# Patient Record
Sex: Male | Born: 1955 | Race: Black or African American | Hispanic: No | Marital: Single
Health system: Midwestern US, Community
[De-identification: ages and names within clinical notes are randomized; demographics above are authoritative.]

## PROBLEM LIST (undated history)

## (undated) DIAGNOSIS — I2510 Atherosclerotic heart disease of native coronary artery without angina pectoris: Secondary | ICD-10-CM

## (undated) DIAGNOSIS — E119 Type 2 diabetes mellitus without complications: Secondary | ICD-10-CM

## (undated) DIAGNOSIS — I639 Cerebral infarction, unspecified: Secondary | ICD-10-CM

## (undated) DIAGNOSIS — Z87898 Personal history of other specified conditions: Secondary | ICD-10-CM

## (undated) DIAGNOSIS — G4733 Obstructive sleep apnea (adult) (pediatric): Secondary | ICD-10-CM

## (undated) DIAGNOSIS — M109 Gout, unspecified: Secondary | ICD-10-CM

## (undated) DIAGNOSIS — K219 Gastro-esophageal reflux disease without esophagitis: Secondary | ICD-10-CM

## (undated) DIAGNOSIS — I10 Essential (primary) hypertension: Secondary | ICD-10-CM

## (undated) DIAGNOSIS — R011 Cardiac murmur, unspecified: Secondary | ICD-10-CM

## (undated) DIAGNOSIS — G8929 Other chronic pain: Secondary | ICD-10-CM

## (undated) DIAGNOSIS — M1990 Unspecified osteoarthritis, unspecified site: Secondary | ICD-10-CM

## (undated) DIAGNOSIS — M25561 Pain in right knee: Secondary | ICD-10-CM

## (undated) DIAGNOSIS — I25119 Atherosclerotic heart disease of native coronary artery with unspecified angina pectoris: Secondary | ICD-10-CM

## (undated) DIAGNOSIS — I252 Old myocardial infarction: Secondary | ICD-10-CM

## (undated) DIAGNOSIS — K2950 Unspecified chronic gastritis without bleeding: Secondary | ICD-10-CM

## (undated) DIAGNOSIS — M25562 Pain in left knee: Secondary | ICD-10-CM

## (undated) DIAGNOSIS — Z0389 Encounter for observation for other suspected diseases and conditions ruled out: Secondary | ICD-10-CM

## (undated) DIAGNOSIS — A048 Other specified bacterial intestinal infections: Secondary | ICD-10-CM

## (undated) HISTORY — PX: EYE SURGERY: SHX253

## (undated) HISTORY — PX: BUNIONECTOMY: SHX129

## (undated) HISTORY — PX: JOINT REPLACEMENT: SHX530

---

## 2014-09-24 DIAGNOSIS — I214 Non-ST elevation (NSTEMI) myocardial infarction: Secondary | ICD-10-CM

## 2014-09-24 HISTORY — DX: Non-ST elevation (NSTEMI) myocardial infarction: I21.4

## 2014-09-24 HISTORY — PX: CARDIAC CATHETERIZATION: SHX172

## 2020-02-17 ENCOUNTER — Inpatient Hospital Stay
Admit: 2020-02-17 | Discharge: 2020-02-18 | Disposition: A | Discharge status: Home / Self Care | Payer: MEDICARE | Attending: Emergency Medicine

## 2020-02-17 ENCOUNTER — Emergency Department: Admit: 2020-02-18 | Payer: MEDICARE | Primary: Family Medicine

## 2020-02-17 DIAGNOSIS — B349 Viral infection, unspecified: Secondary | ICD-10-CM

## 2020-02-17 NOTE — ED Provider Notes (Signed)
Patient presents with complaint of chills and body aches for past 2 days . He reports frequent urination and denies cough, shortness of breath, or abdominal pain. He has a mild headache. No neck stiffness He has no other complaints. He received his first covid vaccine.            Past Medical History:   Diagnosis Date   ??? Chronic GERD    ??? Diabetes (HCC)    ??? Erectile dysfunction    ??? Hypercholesteremia    ??? Hypertension        History reviewed. No pertinent surgical history.      History reviewed. No pertinent family history.    Social History     Socioeconomic History   ??? Marital status: SINGLE     Spouse name: Not on file   ??? Number of children: Not on file   ??? Years of education: Not on file   ??? Highest education level: Not on file   Occupational History   ??? Not on file   Tobacco Use   ??? Smoking status: Never Smoker   ??? Smokeless tobacco: Never Used   Substance and Sexual Activity   ??? Alcohol use: Not on file   ??? Drug use: Not on file   ??? Sexual activity: Not on file   Other Topics Concern   ??? Not on file   Social History Narrative   ??? Not on file     Social Determinants of Health     Financial Resource Strain:    ??? Difficulty of Paying Living Expenses:    Food Insecurity:    ??? Worried About Programme researcher, broadcasting/film/video in the Last Year:    ??? Barista in the Last Year:    Transportation Needs:    ??? Freight forwarder (Medical):    ??? Lack of Transportation (Non-Medical):    Physical Activity:    ??? Days of Exercise per Week:    ??? Minutes of Exercise per Session:    Stress:    ??? Feeling of Stress :    Social Connections:    ??? Frequency of Communication with Friends and Family:    ??? Frequency of Social Gatherings with Friends and Family:    ??? Attends Religious Services:    ??? Database administrator or Organizations:    ??? Attends Engineer, structural:    ??? Marital Status:    Intimate Programme researcher, broadcasting/film/video Violence:    ??? Fear of Current or Ex-Partner:    ??? Emotionally Abused:    ??? Physically Abused:    ??? Sexually Abused:           ALLERGIES: Patient has no known allergies.    Review of Systems   Constitutional: Positive for chills and fever.   HENT: Negative.    Eyes: Negative.    Respiratory: Negative.  Negative for cough and shortness of breath.    Cardiovascular: Negative.  Negative for chest pain.   Gastrointestinal: Negative for abdominal pain, diarrhea, nausea and vomiting.   Endocrine: Negative.    Genitourinary: Negative.    Musculoskeletal: Positive for arthralgias and myalgias.   Skin: Negative.    Allergic/Immunologic: Negative.    Neurological: Negative.    Hematological: Negative.    Psychiatric/Behavioral: Negative.        Vitals:    02/17/20 2003   BP: (!) 144/76   Pulse: 80   Resp: 16   Temp: 99.2 ??F (37.3 ??C)  SpO2: 96%   Weight: 110.7 kg (244 lb)   Height: 5\' 8"  (1.727 m)            Physical Exam  Vitals and nursing note reviewed.   HENT:      Head: Normocephalic and atraumatic.      Mouth/Throat:      Mouth: Mucous membranes are moist.   Eyes:      Extraocular Movements: Extraocular movements intact.      Pupils: Pupils are equal, round, and reactive to light.   Cardiovascular:      Rate and Rhythm: Normal rate and regular rhythm.      Pulses: Normal pulses.      Heart sounds: Normal heart sounds.   Pulmonary:      Effort: Pulmonary effort is normal.      Breath sounds: Normal breath sounds.   Abdominal:      General: Abdomen is flat. Bowel sounds are normal.      Palpations: Abdomen is soft.   Musculoskeletal:         General: Normal range of motion.      Cervical back: Normal range of motion and neck supple.   Skin:     General: Skin is warm and dry.   Neurological:      General: No focal deficit present.      Mental Status: He is oriented to person, place, and time. Mental status is at baseline.   Psychiatric:         Mood and Affect: Mood normal.         Behavior: Behavior normal.          MDM       Procedures

## 2020-02-17 NOTE — ED Notes (Signed)
Patient presents to ED reporting chills and headache for two days.  Patient reports he recceived a flu shot and the first dose of his covid vaccine 1 week ago.  Patient denies fever, sore throat, cough, and congestion.

## 2020-02-18 LAB — CBC WITH AUTO DIFFERENTIAL
Basophils %: 3 % — ABNORMAL HIGH (ref 0.0–2.5)
Basophils Absolute: 0.2 10*3/uL (ref 0.0–0.2)
Eosinophils %: 3 % — ABNORMAL HIGH (ref 0.9–2.9)
Eosinophils Absolute: 0.3 10*3/uL (ref 0.0–0.7)
Hematocrit: 33.5 % — ABNORMAL LOW (ref 41–53)
Hemoglobin: 11.2 g/dL — ABNORMAL LOW (ref 13.0–16.0)
Lymphocytes %: 37 % (ref 20.5–51.1)
Lymphocytes Absolute: 3.3 10*3/uL (ref 1.0–4.8)
MCH: 29.8 PG — ABNORMAL LOW (ref 31–34)
MCHC: 33.6 g/dL (ref 31.0–36.0)
MCV: 88.7 FL (ref 80–100)
MPV: 7.8 FL (ref 6.5–11.5)
Monocytes %: 12 % (ref 3.1–13.9)
Monocytes Absolute: 1.1 10*3/uL — ABNORMAL HIGH (ref 0.0–0.8)
Neutrophils %: 45 % (ref 42–75)
Neutrophils Absolute: 4.1 10*3/uL (ref 1.8–7.7)
Platelets: 174 10*3/uL
RBC: 3.77 M/uL
RDW: 12.5 % (ref 11.5–14.5)
WBC: 9 10*3/uL (ref 4.4–11.3)

## 2020-02-18 LAB — RAPID INFLUENZA A/B ANTIGENS
Flu A Antigen: NEGATIVE
Influenza B Antigen: NEGATIVE

## 2020-02-18 LAB — COMPREHENSIVE METABOLIC PANEL
ALT: 34 U/L (ref 12–78)
AST: 24 U/L (ref 15–37)
Albumin/Globulin Ratio: 0.8 — ABNORMAL LOW (ref 1.1–2.2)
Albumin: 3.1 g/dL — ABNORMAL LOW (ref 3.5–5.0)
Alkaline Phosphatase: 45 U/L (ref 45–117)
Anion Gap: 8 mmol/L (ref 5–15)
BUN: 13 mg/dL (ref 6–20)
Bun/Cre Ratio: 14 (ref 12–20)
CO2: 29 mmol/L (ref 21–32)
Calcium: 8.6 mg/dL (ref 8.5–10.1)
Chloride: 105 mmol/L (ref 97–108)
Creatinine: 0.93 mg/dL (ref 0.70–1.30)
EGFR IF NonAfrican American: >60 mL/min/{1.73_m2} (ref >60)
GFR African American: >60 mL/min/{1.73_m2} (ref >60)
Globulin: 3.9 g/dL (ref 2.0–4.0)
Glucose: 118 mg/dL — ABNORMAL HIGH (ref 65–100)
Potassium: 3.9 mmol/L (ref 3.5–5.1)
Sodium: 142 mmol/L (ref 136–145)
Total Bilirubin: 0.3 mg/dL (ref 0.2–1.0)
Total Protein: 7 g/dL (ref 6.4–8.2)

## 2020-02-18 LAB — EKG 12-LEAD
Atrial Rate: 63 {beats}/min
P Axis: 35 degrees
P-R Interval: 164 ms
Q-T Interval: 427 ms
QRS Duration: 91 ms
QTc Calculation (Bazett): 438 ms
R Axis: 20 degrees
T Axis: 28 degrees
Ventricular Rate: 63 {beats}/min

## 2020-02-18 LAB — URINALYSIS W/ RFLX MICROSCOPIC
Bilirubin, Urine: NEGATIVE
Bilirubin: NEGATIVE
Blood, Urine: NEGATIVE
Blood: NEGATIVE
Glucose, Ur: NEGATIVE mg/dL
Glucose: NEGATIVE mg/dL
Ketone: NEGATIVE mg/dL
Ketones, Urine: NEGATIVE mg/dL
Leukocyte Esterase, Urine: NEGATIVE
Leukocyte Esterase: NEGATIVE
Nitrite, Urine: NEGATIVE
Nitrites: NEGATIVE
Protein, UA: NEGATIVE mg/dL
Protein: NEGATIVE mg/dL
Specific Gravity, UA: 1.03 (ref 1.003–1.030)
Specific gravity: 1.03 (ref 1.003–1.030)
Urobilinogen, UA, POCT: 1 EU/dL (ref 0.2–1.0)
Urobilinogen: 1 EU/dL (ref 0.2–1.0)
pH (UA): 6 (ref 5.0–8.0)
pH, UA: 6 (ref 5.0–8.0)

## 2020-02-18 LAB — COVID-19, RAPID: SARS-CoV-2, Rapid: NOT DETECTED

## 2020-02-18 LAB — METABOLIC PANEL, COMPREHENSIVE
A-G Ratio: 0.8 — ABNORMAL LOW (ref 1.1–2.2)
ALT (SGPT): 34 U/L (ref 12–78)
AST (SGOT): 24 U/L (ref 15–37)
Albumin: 3.1 g/dL — ABNORMAL LOW (ref 3.5–5.0)
Alk. phosphatase: 45 U/L (ref 45–117)
Anion gap: 8 mmol/L (ref 5–15)
BUN/Creatinine ratio: 14 (ref 12–20)
BUN: 13 mg/dL (ref 6–20)
Bilirubin, total: 0.3 mg/dL (ref 0.2–1.0)
CO2: 29 mmol/L (ref 21–32)
Calcium: 8.6 mg/dL (ref 8.5–10.1)
Chloride: 105 mmol/L (ref 97–108)
Creatinine: 0.93 mg/dL (ref 0.70–1.30)
GFR est AA: >60 mL/min/{1.73_m2} (ref >60)
GFR est non-AA: >60 mL/min/{1.73_m2} (ref >60)
Globulin: 3.9 g/dL (ref 2.0–4.0)
Glucose: 118 mg/dL — ABNORMAL HIGH (ref 65–100)
Potassium: 3.9 mmol/L (ref 3.5–5.1)
Protein, total: 7 g/dL (ref 6.4–8.2)
Sodium: 142 mmol/L (ref 136–145)

## 2020-02-18 LAB — EKG, 12 LEAD, INITIAL
Atrial Rate: 63 {beats}/min
Calculated P Axis: 35 degrees
Calculated R Axis: 20 degrees
Calculated T Axis: 28 degrees
P-R Interval: 164 ms
Q-T Interval: 427 ms
QRS Duration: 91 ms
QTC Calculation (Bezet): 438 ms
Ventricular Rate: 63 {beats}/min

## 2020-02-18 LAB — CBC WITH AUTOMATED DIFF
ABS. BASOPHILS: 0.2 10*3/uL (ref 0.0–0.2)
ABS. EOSINOPHILS: 0.3 10*3/uL (ref 0.0–0.7)
ABS. LYMPHOCYTES: 3.3 10*3/uL (ref 1.0–4.8)
ABS. MONOCYTES: 1.1 10*3/uL — ABNORMAL HIGH (ref 0.0–0.8)
ABS. NEUTROPHILS: 4.1 10*3/uL (ref 1.8–7.7)
BASOPHILS: 3 % — ABNORMAL HIGH (ref 0.0–2.5)
EOSINOPHILS: 3 % — ABNORMAL HIGH (ref 0.9–2.9)
HCT: 33.5 % — ABNORMAL LOW (ref 41–53)
HGB: 11.2 g/dL — ABNORMAL LOW (ref 13.0–16.0)
LYMPHOCYTES: 37 % (ref 20.5–51.1)
MCH: 29.8 PG — ABNORMAL LOW (ref 31–34)
MCHC: 33.6 g/dL (ref 31.0–36.0)
MCV: 88.7 FL (ref 80–100)
MONOCYTES: 12 % (ref 3.1–13.9)
MPV: 7.8 FL (ref 6.5–11.5)
NEUTROPHILS: 45 % (ref 42–75)
PLATELET: 174 10*3/uL
RBC: 3.77 M/uL
RDW: 12.5 % (ref 11.5–14.5)
WBC: 9 10*3/uL (ref 4.4–11.3)

## 2020-02-18 LAB — COVID-19 RAPID TEST: COVID-19 rapid test: NOT DETECTED

## 2020-02-18 LAB — INFLUENZA A & B AG (RAPID TEST)
Influenza A Antigen: NEGATIVE
Influenza B Antigen: NEGATIVE

## 2020-02-18 MED ORDER — SODIUM CHLORIDE 0.9 % IV
Freq: Once | INTRAVENOUS | Status: AC
Start: 2020-02-18 — End: 2020-02-18
  Administered 2020-02-18: 02:00:00 via INTRAVENOUS

## 2020-02-18 MED FILL — SODIUM CHLORIDE 0.9 % IV: INTRAVENOUS | Qty: 1000

## 2020-02-18 NOTE — ED Notes (Signed)
Patient given and verbalized understanding of all discharge, medication, and follow-up instructions.  Patient departed ED ambulatory in good condition with family member.

## 2020-02-23 LAB — CULTURE, BLOOD 1: Culture: NO GROWTH

## 2020-02-23 LAB — CULTURE, BLOOD: Culture result:: NO GROWTH

## 2020-03-20 ENCOUNTER — Emergency Department: Admit: 2020-03-20 | Payer: MEDICARE | Primary: Family Medicine

## 2020-03-20 ENCOUNTER — Inpatient Hospital Stay
Admit: 2020-03-20 | Discharge: 2020-03-20 | Disposition: A | Discharge status: Home / Self Care | Payer: MEDICARE | Attending: Emergency Medicine

## 2020-03-20 DIAGNOSIS — M25561 Pain in right knee: Secondary | ICD-10-CM

## 2020-03-20 LAB — COMPREHENSIVE METABOLIC PANEL
ALT: 23 U/L (ref 12–78)
AST: 18 U/L (ref 15–37)
Albumin/Globulin Ratio: 1 — ABNORMAL LOW (ref 1.1–2.2)
Albumin: 3.4 g/dL — ABNORMAL LOW (ref 3.5–5.0)
Alkaline Phosphatase: 45 U/L (ref 45–117)
Anion Gap: 8 mmol/L (ref 5–15)
BUN: 13 mg/dL (ref 6–20)
Bun/Cre Ratio: 14 (ref 12–20)
CO2: 29 mmol/L (ref 21–32)
Calcium: 8.5 mg/dL (ref 8.5–10.1)
Chloride: 104 mmol/L (ref 97–108)
Creatinine: 0.9 mg/dL (ref 0.70–1.30)
EGFR IF NonAfrican American: >60 mL/min/{1.73_m2} (ref >60)
GFR African American: >60 mL/min/{1.73_m2} (ref >60)
Globulin: 3.5 g/dL (ref 2.0–4.0)
Glucose: 125 mg/dL — ABNORMAL HIGH (ref 65–100)
Potassium: 4.4 mmol/L (ref 3.5–5.1)
Sodium: 141 mmol/L (ref 136–145)
Total Bilirubin: 0.4 mg/dL (ref 0.2–1.0)
Total Protein: 6.9 g/dL (ref 6.4–8.2)

## 2020-03-20 LAB — CBC WITH AUTO DIFFERENTIAL
Basophils %: 1 % (ref 0.0–2.5)
Basophils Absolute: 0.1 10*3/uL (ref 0.0–0.2)
Eosinophils %: 3 % — ABNORMAL HIGH (ref 0.9–2.9)
Eosinophils Absolute: 0.2 10*3/uL (ref 0.0–0.7)
Hematocrit: 39.2 % — ABNORMAL LOW (ref 41–53)
Hemoglobin: 13 g/dL — ABNORMAL LOW (ref 13.5–17.5)
Lymphocytes %: 27 % (ref 20.5–51.1)
Lymphocytes Absolute: 2.1 10*3/uL (ref 1.0–4.8)
MCH: 29.6 PG — ABNORMAL LOW (ref 31–34)
MCHC: 33.2 g/dL (ref 31.0–36.0)
MCV: 89 FL (ref 80–100)
MPV: 7.9 FL (ref 6.5–11.5)
Monocytes %: 8 % (ref 1.7–9.3)
Monocytes Absolute: 0.6 10*3/uL (ref 0.2–2.4)
NRBC Absolute: 0.01 10*3/uL
Neutrophils %: 61 % (ref 42–75)
Neutrophils Absolute: 4.7 10*3/uL (ref 1.8–7.7)
Nucleated RBCs: 0.2 PER 100 WBC
Platelets: 189 10*3/uL (ref 150–400)
RBC: 4.4 M/uL — ABNORMAL LOW (ref 4.50–5.90)
RDW: 13.5 % (ref 11.5–14.5)
WBC: 7.7 10*3/uL (ref 4.4–11.3)

## 2020-03-20 LAB — METABOLIC PANEL, COMPREHENSIVE
A-G Ratio: 1 — ABNORMAL LOW (ref 1.1–2.2)
ALT (SGPT): 23 U/L (ref 12–78)
AST (SGOT): 18 U/L (ref 15–37)
Albumin: 3.4 g/dL — ABNORMAL LOW (ref 3.5–5.0)
Alk. phosphatase: 45 U/L (ref 45–117)
Anion gap: 8 mmol/L (ref 5–15)
BUN/Creatinine ratio: 14 (ref 12–20)
BUN: 13 mg/dL (ref 6–20)
Bilirubin, total: 0.4 mg/dL (ref 0.2–1.0)
CO2: 29 mmol/L (ref 21–32)
Calcium: 8.5 mg/dL (ref 8.5–10.1)
Chloride: 104 mmol/L (ref 97–108)
Creatinine: 0.9 mg/dL (ref 0.70–1.30)
GFR est AA: >60 mL/min/{1.73_m2} (ref >60)
GFR est non-AA: >60 mL/min/{1.73_m2} (ref >60)
Globulin: 3.5 g/dL (ref 2.0–4.0)
Glucose: 125 mg/dL — ABNORMAL HIGH (ref 65–100)
Potassium: 4.4 mmol/L (ref 3.5–5.1)
Protein, total: 6.9 g/dL (ref 6.4–8.2)
Sodium: 141 mmol/L (ref 136–145)

## 2020-03-20 LAB — CBC WITH AUTOMATED DIFF
ABS. BASOPHILS: 0.1 10*3/uL (ref 0.0–0.2)
ABS. EOSINOPHILS: 0.2 10*3/uL (ref 0.0–0.7)
ABS. LYMPHOCYTES: 2.1 10*3/uL (ref 1.0–4.8)
ABS. MONOCYTES: 0.6 10*3/uL (ref 0.2–2.4)
ABS. NEUTROPHILS: 4.7 10*3/uL (ref 1.8–7.7)
ABSOLUTE NRBC: 0.01 10*3/uL
BASOPHILS: 1 % (ref 0.0–2.5)
EOSINOPHILS: 3 % — ABNORMAL HIGH (ref 0.9–2.9)
HCT: 39.2 % — ABNORMAL LOW (ref 41–53)
HGB: 13 g/dL — ABNORMAL LOW (ref 13.5–17.5)
LYMPHOCYTES: 27 % (ref 20.5–51.1)
MCH: 29.6 PG — ABNORMAL LOW (ref 31–34)
MCHC: 33.2 g/dL (ref 31.0–36.0)
MCV: 89 FL (ref 80–100)
MONOCYTES: 8 % (ref 1.7–9.3)
MPV: 7.9 FL (ref 6.5–11.5)
NEUTROPHILS: 61 % (ref 42–75)
NRBC: 0.2 PER 100 WBC
PLATELET: 189 10*3/uL (ref 150–400)
RBC: 4.4 M/uL — ABNORMAL LOW (ref 4.50–5.90)
RDW: 13.5 % (ref 11.5–14.5)
WBC: 7.7 10*3/uL (ref 4.4–11.3)

## 2020-03-20 NOTE — ED Provider Notes (Signed)
ED Provider Notes by Domingo Dimes, MD at 03/20/20 [phone]                Author: Domingo Dimes, MD  Service: EMERGENCY  Author Type: Physician       Filed: 03/20/20 0902  Date of Service: 03/20/20 0751  Status: Signed          Editor: Domingo Dimes, MD (Physician)               EMERGENCY DEPARTMENT HISTORY AND PHYSICAL EXAM   ?      Date: 03/20/2020   Patient Name: Adrian Saunders      History of Presenting Illness      Patient presents with:   Knee Pain         History Provided By: Patient      HPI: Adrian Saunders, 64 y.o. male with a past medical history significant for diabetes, hypertension and gout presents to the ED with cc of complains of right leg pain for 1 week.  Pain is mostly behind the right knee, but also involves the calf.   Pain is radiating into the thigh now.  He denies injury.  Pain is worse with palpation and ambulation.  Pain is severe.      There are no other complaints, changes, or physical findings at this time.      PCP: Ruben Im, MD      No current facility-administered medications on file prior to encounter.   Current Outpatient Medications on File Prior to Encounter:   pantoprazole (PROTONIX) 40 mg tablet, 1 tab(s), Disp: , Rfl:    aspirin delayed-release 81 mg tablet, 1 tab(s), Disp: , Rfl:    metFORMIN (GLUCOPHAGE) 500 mg tablet, metformin 500 mg tablet, Disp: , Rfl:    allopurinoL (ZYLOPRIM) 300 mg tablet, 1 tab(s), Disp: , Rfl:    oxyCODONE-acetaminophen (PERCOCET 10) 10-325 mg per tablet, oxycodone-acetaminophen 10 mg-325 mg tablet, Disp: , Rfl:             Past History      Past Medical History:   Past Medical History:   No date: Chronic GERD   No date: Diabetes (HCC)   No date: Erectile dysfunction   No date: Hypercholesteremia   No date: Hypertension      Past Surgical History:   No past surgical history on file.      Family History:   History reviewed.  No pertinent family history.         Social History:   Social History     Tobacco Use      Smoking status: Never Smoker      Smokeless tobacco: Never Used    Alcohol use: Not on file    Drug use: Not on file         Allergies:   No Known Allergies         Review of Systems   @ROSBYAGE @      Physical Exam   @PHYEXAMBYAGE @      Diagnostic Study Results      Labs -   No results found for this or any previous visit (from the past 12 hour(s)).      Radiologic Studies -    No orders to display   CT Results  (Last 48 hours)    None       CXR Results  (Last 48 hours)    None  Medical Decision Making   I am the first provider for this patient.      I reviewed the vital signs, available nursing notes, past medical history, past surgical history, family history and social history.      Vital Signs-Reviewed the patient's vital signs.   Empty flowsheet group.         Records Reviewed: Nursing Notes      Provider Notes (Medical Decision Making):    Differential diagnosis consists of DVT, Baker's cyst, gouty arthritis.      ED Course:    Initial assessment performed. The patients presenting problems have been discussed, and they are in agreement with the care plan formulated and outlined with them.  I have encouraged them to ask questions as they arise throughout their visit.                     PLAN:   1. Current Discharge Medication List         2. Follow-up Information     None      Return to ED if worse       Diagnosis      Clinical Impression: No diagnosis found.         ?                  Past Medical History:        Diagnosis  Date         ?  Chronic GERD       ?  Diabetes (HCC)       ?  Erectile dysfunction       ?  Hypercholesteremia           ?  Hypertension             No past surgical history on file.        History reviewed. No pertinent family history.        Social History          Socioeconomic History         ?  Marital status:  SINGLE              Spouse name:  Not on file         ?  Number of children:  Not on file     ?  Years of education:  Not on file     ?  Highest  education level:  Not on file       Occupational History        ?  Not on file       Tobacco Use         ?  Smoking status:  Never Smoker     ?  Smokeless tobacco:  Never Used       Substance and Sexual Activity         ?  Alcohol use:  Not on file     ?  Drug use:  Not on file     ?  Sexual activity:  Not on file        Other Topics  Concern        ?  Not on file       Social History Narrative        ?  Not on file          Social Determinants of Health          Financial Resource Strain:         ?  Difficulty of Paying Living Expenses:        Food Insecurity:         ?  Worried About Programme researcher, broadcasting/film/video in the Last Year:      ?  Barista in the Last Year:        Transportation Needs:         ?  Freight forwarder (Medical):      ?  Lack of Transportation (Non-Medical):        Physical Activity:         ?  Days of Exercise per Week:      ?  Minutes of Exercise per Session:        Stress:         ?  Feeling of Stress :        Social Connections:         ?  Frequency of Communication with Friends and Family:      ?  Frequency of Social Gatherings with Friends and Family:      ?  Attends Religious Services:      ?  Active Member of Clubs or Organizations:      ?  Attends Banker Meetings:      ?  Marital Status:        Intimate Partner Violence:         ?  Fear of Current or Ex-Partner:      ?  Emotionally Abused:      ?  Physically Abused:         ?  Sexually Abused:               ALLERGIES: Patient has no known allergies.      Review of Systems    Constitutional: Negative.     HENT: Negative.     Eyes: Negative.     Respiratory: Negative.     Cardiovascular: Negative.     Gastrointestinal: Negative.     Endocrine: Negative.     Genitourinary: Negative.     Musculoskeletal:         Right leg pain, calf pain and thigh pain    Skin: Negative.     Allergic/Immunologic: Negative.     Neurological: Negative.     Hematological: Negative.     Psychiatric/Behavioral: Negative.             Vitals:           03/20/20 0717        BP:  (!) 178/94     Pulse:  77     Resp:  17     Temp:  99.1 ??F (37.3 ??C)     SpO2:  99%     Weight:  104.3 kg (230 lb)        Height:  5\' 8"  (1.727 m)                Physical Exam   Vitals and nursing note reviewed.   Constitutional:        Appearance: Normal appearance.   Eyes:       Extraocular Movements: Extraocular movements intact.      Conjunctiva/sclera: Conjunctivae normal.      Pupils: Pupils are equal, round, and reactive to light.   Cardiovascular :       Rate and Rhythm: Normal rate and regular rhythm.      Pulses: Normal pulses.  Heart sounds: Normal heart sounds.    Pulmonary:       Effort: Pulmonary effort is normal.      Breath sounds: Normal breath sounds.   Abdominal :      General: Abdomen is flat. Bowel sounds are normal.      Palpations: Abdomen is soft.     Musculoskeletal:        Legs:         Comments: Tender popliteal area, tender calf.    Skin:      General: Skin is warm.   Neurological :       Mental Status: He is alert.             MDM          Procedures

## 2020-03-20 NOTE — ED Notes (Signed)
Pt reports knee pain x 2 weeks. PT reports hx of blood clots. Pt reports hx of gout

## 2020-09-07 ENCOUNTER — Inpatient Hospital Stay
Admit: 2020-09-07 | Discharge: 2020-09-07 | Disposition: A | Discharge status: Home / Self Care | Payer: MEDICARE | Attending: Emergency Medicine

## 2020-09-07 DIAGNOSIS — M10341 Gout due to renal impairment, right hand: Secondary | ICD-10-CM

## 2020-09-07 MED ORDER — IBUPROFEN 600 MG TAB
600 mg | ORAL | Status: AC
Start: 2020-09-07 — End: 2020-09-07
  Administered 2020-09-07: 18:00:00 via ORAL

## 2020-09-07 MED ORDER — PREDNISONE 20 MG TAB
20 mg | ORAL | Status: AC
Start: 2020-09-07 — End: 2020-09-07
  Administered 2020-09-07: 18:00:00 via ORAL

## 2020-09-07 MED ORDER — PREDNISONE 20 MG TAB
20 mg | ORAL_TABLET | Freq: Every day | ORAL | 0 refills | Status: AC
Start: 2020-09-07 — End: 2020-09-12

## 2020-09-07 MED FILL — IBUPROFEN 600 MG TAB: 600 mg | ORAL | Qty: 1

## 2020-09-07 MED FILL — PREDNISONE 20 MG TAB: 20 mg | ORAL | Qty: 3

## 2020-09-07 NOTE — ED Notes (Signed)
Pt given discharge and follow up instructions. Pt advised to follow with PCP. tt educated by MD tto hold allopurinol until flare subsides. Pain management and meds education given.

## 2020-09-07 NOTE — ED Provider Notes (Signed)
HPI   Patient reports several days of spontaneous and worsening pain and swelling in the right hand.  Is mainly on the dorsal surface and about the ring finger metacarpal.  He has a history of gout but reports he has never had involvement in an upper extremity.  He is currently on allopurinol for suppression and reports that this has not been effective.  Denies any other complaints.  Denies any injury or trauma.  Past Medical History:   Diagnosis Date   ??? Chronic GERD    ??? Diabetes (HCC)    ??? Erectile dysfunction    ??? Hypercholesteremia    ??? Hypertension        No past surgical history on file.      No family history on file.    Social History     Socioeconomic History   ??? Marital status: SINGLE     Spouse name: Not on file   ??? Number of children: Not on file   ??? Years of education: Not on file   ??? Highest education level: Not on file   Occupational History   ??? Not on file   Tobacco Use   ??? Smoking status: Never Smoker   ??? Smokeless tobacco: Never Used   Substance and Sexual Activity   ??? Alcohol use: Yes     Comment: occasionally   ??? Drug use: Not on file   ??? Sexual activity: Not on file   Other Topics Concern   ??? Not on file   Social History Narrative   ??? Not on file     Social Determinants of Health     Financial Resource Strain:    ??? Difficulty of Paying Living Expenses: Not on file   Food Insecurity:    ??? Worried About Running Out of Food in the Last Year: Not on file   ??? Ran Out of Food in the Last Year: Not on file   Transportation Needs:    ??? Lack of Transportation (Medical): Not on file   ??? Lack of Transportation (Non-Medical): Not on file   Physical Activity:    ??? Days of Exercise per Week: Not on file   ??? Minutes of Exercise per Session: Not on file   Stress:    ??? Feeling of Stress : Not on file   Social Connections:    ??? Frequency of Communication with Friends and Family: Not on file   ??? Frequency of Social Gatherings with Friends and Family: Not on file   ??? Attends Religious Services: Not on file   ???  Active Member of Clubs or Organizations: Not on file   ??? Attends Banker Meetings: Not on file   ??? Marital Status: Not on file   Intimate Partner Violence:    ??? Fear of Current or Ex-Partner: Not on file   ??? Emotionally Abused: Not on file   ??? Physically Abused: Not on file   ??? Sexually Abused: Not on file   Housing Stability:    ??? Unable to Pay for Housing in the Last Year: Not on file   ??? Number of Places Lived in the Last Year: Not on file   ??? Unstable Housing in the Last Year: Not on file         ALLERGIES: Patient has no known allergies.    Review of Systems   Constitutional: Negative.    HENT: Negative.    Eyes: Negative.    Respiratory: Negative.  Cardiovascular: Negative.    Gastrointestinal: Negative.    Endocrine: Negative.    Genitourinary: Negative.    Musculoskeletal: Positive for arthralgias, joint swelling and myalgias.   Allergic/Immunologic: Negative.    Neurological: Negative.    Hematological: Negative.    Psychiatric/Behavioral: Negative.    All other systems reviewed and are negative.      Vitals:    09/07/20 1309   BP: (!) 190/103   Pulse: 92   Resp: 20   Temp: 99.6 ??F (37.6 ??C)   SpO2: 98%   Weight: 113.4 kg (250 lb)   Height: 5\' 8"  (1.727 m)            Physical Exam  Vitals and nursing note reviewed.   Constitutional:       General: He is not in acute distress.     Appearance: Normal appearance. He is obese. He is not ill-appearing, toxic-appearing or diaphoretic.   HENT:      Head: Normocephalic and atraumatic.      Nose: Nose normal.      Mouth/Throat:      Mouth: Mucous membranes are moist.      Pharynx: Oropharynx is clear.   Eyes:      Conjunctiva/sclera: Conjunctivae normal.      Pupils: Pupils are equal, round, and reactive to light.   Cardiovascular:      Pulses: Normal pulses.   Pulmonary:      Effort: Pulmonary effort is normal. No respiratory distress.   Musculoskeletal:         General: Swelling and tenderness present. No deformity or signs of injury. Normal range  of motion.      Cervical back: Normal range of motion.      Right lower leg: No edema.      Left lower leg: No edema.      Comments: Right hand is grossly edematous.  There is exquisite tenderness at the base of the right ring finger on the dorsal surface.  No tendinitis or lymphadenitis.  No changes or breaks in skin.   Skin:     General: Skin is warm and dry.      Capillary Refill: Capillary refill takes less than 2 seconds.      Coloration: Skin is not jaundiced or pale.      Findings: No bruising, erythema, lesion or rash.   Neurological:      General: No focal deficit present.      Mental Status: He is alert and oriented to person, place, and time. Mental status is at baseline.      Cranial Nerves: No cranial nerve deficit.      Sensory: No sensory deficit.      Motor: No weakness.      Coordination: Coordination normal.      Gait: Gait normal.   Psychiatric:         Mood and Affect: Mood normal.         Behavior: Behavior normal.          MDM     Consistent with acute gout.  Will place on a course of steroids.  Procedures

## 2020-09-07 NOTE — ED Notes (Signed)
Patient hand has been swelling since Thursday; radial pulse equal bilateral; hand hot to touch

## 2020-09-08 ENCOUNTER — Other Ambulatory Visit: Payer: Self-pay | Admitting: Physical Medicine & Rehabilitation

## 2020-09-08 DIAGNOSIS — G8929 Other chronic pain: Secondary | ICD-10-CM

## 2020-09-12 ENCOUNTER — Other Ambulatory Visit: Payer: Self-pay

## 2020-09-12 ENCOUNTER — Ambulatory Visit
Admission: RE | Admit: 2020-09-12 | Discharge: 2020-09-12 | Disposition: A | Discharge status: Home / Self Care | Payer: Medicare Other | Source: Ambulatory Visit | Attending: Physical Medicine & Rehabilitation | Admitting: Physical Medicine & Rehabilitation

## 2020-09-12 DIAGNOSIS — G8929 Other chronic pain: Secondary | ICD-10-CM | POA: Insufficient documentation

## 2020-09-12 DIAGNOSIS — M5441 Lumbago with sciatica, right side: Secondary | ICD-10-CM | POA: Diagnosis not present

## 2020-10-16 ENCOUNTER — Encounter: Payer: Self-pay | Admitting: Orthopedic Surgery

## 2020-10-16 ENCOUNTER — Other Ambulatory Visit: Payer: Self-pay | Admitting: Orthopedic Surgery

## 2020-10-16 NOTE — H&P (Signed)
Ryan Hinton MRN:  [id number] DOB/SEX:  Feb 12, 1956/male  CHIEF COMPLAINT:  Painful right Knee  HISTORY: Patient is a 65 y.o. male presented with a history of pain in the right knee. Onset of symptoms was gradual starting several years ago with gradually worsening course since that time. Prior procedures on the knee include none. Patient has been treated conservatively with over-the-counter NSAIDs and activity modification. Patient currently rates pain in the knee at 10 out of 10 with activity. There is no pain at night.  PAST MEDICAL HISTORY: There are no problems to display for this patient.  No past medical history on file.    MEDICATIONS:  (Not in a hospital admission)   ALLERGIES:  No Known Allergies  REVIEW OF SYSTEMS:  Pertinent items are noted in HPI.   FAMILY HISTORY:  No family history on file.  SOCIAL HISTORY:   Social History   Tobacco Use  . Smoking status: Not on file  . Smokeless tobacco: Not on file  Substance Use Topics  . Alcohol use: Not on file     EXAMINATION:  Vital signs in last 24 hours: @VSRANGES @  General appearance: alert, cooperative and no distress Neck: no JVD and supple, symmetrical, trachea midline Lungs: clear to auscultation bilaterally Heart: regular rate and rhythm, S1, S2 normal, no murmur, click, rub or gallop Abdomen: soft, non-tender; bowel sounds normal; no masses,  no organomegaly Extremities: extremities normal, atraumatic, no cyanosis or edema and Homans sign is negative, no sign of DVT Pulses: 2+ and symmetric Skin: Skin color, texture, turgor normal. No rashes or lesions Neurologic: Alert and oriented X 3, normal strength and tone. Normal symmetric reflexes. Normal coordination and gait  Musculoskeletal:  ROM 0-100, Ligaments intact,  Imaging Review Plain radiographs demonstrate severe degenerative joint disease of the right knee. The overall alignment is significant varus. The bone quality appears to be good for age  and reported activity level.  Assessment/Plan: Primary osteoarthritis, right knee   The patient history, physical examination and imaging studies are consistent with advanced degenerative joint disease of the right knee. The patient has failed conservative treatment.  The clearance notes were reviewed.  After discussion with the patient it was felt that Total Knee Replacement was indicated. The procedure,  risks, and benefits of total knee arthroplasty were presented and reviewed. The risks including but not limited to aseptic loosening, infection, blood clots, vascular injury, stiffness, patella tracking problems complications among others were discussed. The patient acknowledged the explanation, agreed to proceed with the plan.  10/16/2020, 12:19 PM

## 2020-10-17 ENCOUNTER — Other Ambulatory Visit
Admission: RE | Admit: 2020-10-17 | Discharge: 2020-10-17 | Disposition: A | Discharge status: Home / Self Care | Payer: Medicare Other | Source: Ambulatory Visit | Attending: Orthopedic Surgery | Admitting: Orthopedic Surgery

## 2020-10-17 NOTE — Patient Instructions (Signed)
Your procedure is scheduled on: Report to the Registration Desk on the 1st floor of the [facility]. To find out your arrival time, please call [phone] between 1PM - 3PM on:  REMEMBER: Instructions that are not followed completely may result in serious medical risk, up to and including death; or upon the discretion of your surgeon and anesthesiologist your surgery may need to be rescheduled.  Do not eat food or drink any fluids after midnight the night before surgery.  No gum chewing, lozengers or hard candies.   TAKE THESE MEDICATIONS THE MORNING OF SURGERY WITH A SIP OF WATER:  - allopurinol (ZYLOPRIM) 300 MG tablet - amLODipine (NORVASC) 10 MG tablet - dorzolamide-timolol (COSOPT) 22.3-6.8 MG/ML ophthalmic solution - gabapentin (NEURONTIN) 300 MG capsule - hydrALAZINE (APRESOLINE) 25 MG tablet - metoprolol succinate (TOPROL-XL) 25 MG 24 hr tablet - pantoprazole (PROTONIX) 40 MG tablet, take one the night before and one on the morning of surgery - helps to prevent nausea after surgery.  Stop Metformin 2 days prior to surgery. Do not Take on 04/04, 04/05, and do not take the morning of surgery.  Follow recommendations from Cardiologist, Pulmonologist or PCP regarding stopping Aspirin, Coumadin, Plavix, Eliquis, Pradaxa, or Pletal.  One week prior to surgery: Stop Anti-inflammatories (NSAIDS) such as Advil, Aleve, Ibuprofen, Motrin, Naproxen, Naprosyn and Aspirin based products such as Excedrin, Goodys Powder, BC Powder. Stop ANY OVER THE COUNTER supplements until after surgery.  No Alcohol for 24 hours before or after surgery.  No Smoking including e-cigarettes for 24 hours prior to surgery.  No chewable tobacco products for at least 6 hours prior to surgery.  No nicotine patches on the day of surgery.  Do not use any "recreational" drugs for at least a week prior to your surgery.  Please be advised that the combination of cocaine and anesthesia may have negative  outcomes, up to and including death. If you test positive for cocaine, your surgery will be cancelled.  On the morning of surgery brush your teeth with toothpaste and water, you may rinse your mouth with mouthwash if you wish. Do not swallow any toothpaste or mouthwash.  Do not wear jewelry, make-up, hairpins, clips or nail polish.  Do not wear lotions, powders, or perfumes.   Do not shave body from the neck down 48 hours prior to surgery just in case you cut yourself which could leave a site for infection.  Also, freshly shaved skin may become irritated if using the CHG soap.  Contact lenses, hearing aids and dentures may not be worn into surgery.  Do not bring valuables to the hospital. [facility] is not responsible for any missing/lost belongings or valuables.   Use CHG Soap or wipes as directed on instruction sheet.  Total Shoulder Arthroplasty:  use Benzolyl Peroxide 5% Gel as directed on instruction sheet.  Fleets enema or Magnesium Citrate as directed.  Bring your C-PAP to the hospital with you in case you may have to spend the night.   Notify your doctor if there is any change in your medical condition (cold, fever, infection).  Wear comfortable clothing (specific to your surgery type) to the hospital.  Plan for stool softeners for home use; pain medications have a tendency to cause constipation. You can also help prevent constipation by eating foods high in fiber such as fruits and vegetables and drinking plenty of fluids as your diet allows.  After surgery, you can help prevent lung complications by doing breathing exercises.  Take deep  breaths and cough every 1-2 hours. Your doctor may order a device called an Incentive Spirometer to help you take deep breaths. When coughing or sneezing, hold a pillow firmly against your incision with both hands. This is called "splinting." Doing this helps protect your incision. It also decreases belly discomfort.  If you are being  admitted to the hospital overnight, leave your suitcase in the car. After surgery it may be brought to your room.  If you are being discharged the day of surgery, you will not be allowed to drive home. You will need a responsible adult (18 years or older) to drive you home and stay with you that night.   If you are taking public transportation, you will need to have a responsible adult (18 years or older) with you. Please confirm with your physician that it is acceptable to use public transportation.   Please call the Pre-admissions Testing Dept. at [phone] if you have any questions about these instructions.  Surgery Visitation Policy:  Patients undergoing a surgery or procedure may have one family member or support person with them as long as that person is not COVID-19 positive or experiencing its symptoms.  That person may remain in the waiting area during the procedure.  Inpatient Visitation:    Visiting hours are 7 a.m. to 8 p.m. Inpatients will be allowed two visitors daily. The visitors may change each day during the patient's stay. No visitors under the age of 61. Any visitor under the age of 19 must be accompanied by an adult. The visitor must pass COVID-19 screenings, use hand sanitizer when entering and exiting the patient's room and wear a mask at all times, including in the patient's room. Patients must also wear a mask when staff or their visitor are in the room. Masking is required regardless of vaccination status.

## 2020-10-21 ENCOUNTER — Encounter
Admission: RE | Admit: 2020-10-21 | Discharge: 2020-10-21 | Disposition: A | Discharge status: Home / Self Care | Payer: Medicare Other | Source: Ambulatory Visit | Attending: Orthopedic Surgery | Admitting: Orthopedic Surgery

## 2020-10-21 ENCOUNTER — Other Ambulatory Visit: Payer: Self-pay

## 2020-10-21 DIAGNOSIS — Z01812 Encounter for preprocedural laboratory examination: Secondary | ICD-10-CM | POA: Insufficient documentation

## 2020-10-21 HISTORY — DX: Unspecified osteoarthritis, unspecified site: M19.90

## 2020-10-21 HISTORY — DX: Gastro-esophageal reflux disease without esophagitis: K21.9

## 2020-10-21 HISTORY — DX: Essential (primary) hypertension: I10

## 2020-10-21 HISTORY — DX: Gout, unspecified: M10.9

## 2020-10-21 HISTORY — DX: Type 2 diabetes mellitus without complications: E11.9

## 2020-10-21 HISTORY — DX: Cardiac murmur, unspecified: R01.1

## 2020-10-21 LAB — CBC
HCT: 40.8 % (ref 39.0–52.0)
Hemoglobin: 13.7 g/dL (ref 13.0–17.0)
MCH: 29.7 pg (ref 26.0–34.0)
MCHC: 33.6 g/dL (ref 30.0–36.0)
MCV: 88.5 fL (ref 80.0–100.0)
Platelets: 242 10*3/uL (ref 150–400)
RBC: 4.61 MIL/uL (ref 4.22–5.81)
RDW: 14.3 % (ref 11.5–15.5)
WBC: 14.9 10*3/uL — ABNORMAL HIGH (ref 4.0–10.5)
nRBC: 0 % (ref 0.0–0.2)

## 2020-10-21 LAB — PROTIME-INR
INR: 1 (ref 0.8–1.2)
Prothrombin Time: 12.7 seconds (ref 11.4–15.2)

## 2020-10-21 LAB — URINALYSIS, ROUTINE W REFLEX MICROSCOPIC
Bilirubin Urine: NEGATIVE
Glucose, UA: NEGATIVE mg/dL
Hgb urine dipstick: NEGATIVE
Ketones, ur: NEGATIVE mg/dL
Leukocytes,Ua: NEGATIVE
Nitrite: NEGATIVE
Protein, ur: NEGATIVE mg/dL
Specific Gravity, Urine: 1.029 (ref 1.005–1.030)
pH: 5 (ref 5.0–8.0)

## 2020-10-21 LAB — BASIC METABOLIC PANEL
Anion gap: 7 (ref 5–15)
BUN: 17 mg/dL (ref 8–23)
CO2: 28 mmol/L (ref 22–32)
Calcium: 9.4 mg/dL (ref 8.9–10.3)
Chloride: 101 mmol/L (ref 98–111)
Creatinine, Ser: 0.89 mg/dL (ref 0.61–1.24)
GFR, Estimated: >60 mL/min (ref >60)
Glucose, Bld: 149 mg/dL — ABNORMAL HIGH (ref 70–99)
Potassium: 4.3 mmol/L (ref 3.5–5.1)
Sodium: 136 mmol/L (ref 135–145)

## 2020-10-21 LAB — APTT: aPTT: 34 seconds (ref 24–36)

## 2020-10-21 LAB — TYPE AND SCREEN
ABO/RH(D): O POS
Antibody Screen: NEGATIVE

## 2020-10-21 LAB — SURGICAL PCR SCREEN
MRSA, PCR: NEGATIVE
Staphylococcus aureus: NEGATIVE

## 2020-10-21 NOTE — Patient Instructions (Addendum)
Your procedure is scheduled on:Wed. 4/6  Report to Registration Desk   Then to Same Day Surgery. To find out your arrival time please call [phone] between 1PM - 3PM on Tues 4/5  Remember: Instructions that are not followed completely may result in serious medical risk,  up to and including death, or upon the discretion of your surgeon and anesthesiologist your  surgery may need to be rescheduled.     _X__ 1. Do not eat food after midnight the night before your procedure.                 No chewing gum or hard candies. You may drink clear liquids up to 2 hours                 before you are scheduled to arrive for your surgery- DO not drink clear                 liquids within 2 hours of the start of your surgery.                 Clear Liquids include:  water, apple juice without pulp, clear Gatorade, G2 or                  Gatorade Zero (avoid Red/Purple/Blue), Black Coffee or Tea (Do not add                 anything to coffee or tea). _____2.   Complete the "Ensure Clear Pre-surgery Clear Carbohydrate Drink" provided to you, 2 hours before arrival. **If you       are diabetic you will be provided with an alternative drink, Gatorade Zero or G2.  __X__2.  On the morning of surgery brush your teeth with toothpaste and water, you                may rinse your mouth with mouthwash if you wish.  Do not swallow any toothpaste of mouthwash.     ___ 3.  No Alcohol for 24 hours before or after surgery.   ___ 4.  Do Not Smoke or use e-cigarettes For 24 Hours Prior to Your Surgery.                 Do not use any chewable tobacco products for at least 6 hours prior to                 Surgery.  ___  5.  Do not use any recreational drugs (marijuana, cocaine, heroin, ecstasy, MDMA or other)                For at least one week prior to your surgery.  Combination of these drugs with anesthesia                May have life threatening results.  ____  6.  Bring all  medications with you on the day of surgery if instructed.   _x___  7.  Notify your doctor if there is any change in your medical condition      (cold, fever, infections).     Do not wear jewelry,  Do not wear lotions,  You may wear deodorant. Men may shave face and neck. Do not bring valuables to the hospital.    [facility] is not responsible for any belongings or valuables.  Contacts, dentures or bridgework may not be worn into surgery. Leave your suitcase in the car. After surgery it may  be brought to your room. For patients admitted to the hospital, discharge time is determined by your treatment team.   Patients discharged the day of surgery will not be allowed to drive home.   Make arrangements for someone to be with you for the first 24 hours of your Same Day Discharge.    Please read over the following fact sheets that you were given:   Incentive Spirometer    __x__ Take these medicines the morning of surgery with A SIP OF WATER:    1.gabapentin (NEURONTIN) 300 MG capsule   2. allopurinol (ZYLOPRIM) 300 MG tablet  3. amLODipine (NORVASC) 10 MG tablet  4.dorzolamide-timolol (COSOPT) 22.3-6.8 MG/ML ophthalmic solution  5.hydrALAZINE (APRESOLINE) 25 MG tablet  6.metoprolol succinate (TOPROL-XL) 25 MG 24 hr tablet             7.pantoprazole (PROTONIX) 40 MG tablet   Night before and morning of surgery ____ Fleet Enema (as directed)   __x__ Use CHG Soap (or wipes) as directed  ____ Use Benzoyl Peroxide Gel as instructed  ____ Use inhalers on the day of surgery  __x__ Stop metformin 2 days prior to surgery Last dose on Sunday 4/3   ____ Take 1/2 of usual insulin dose the night before surgery. No insulin the morning          of surgery.   __x__ Stop aspirin tomorrow  ____ Stop Anti-inflammatories on    ____ Stop supplements until after surgery.    ____ Bring C-Pap to the hospital.    If you have any questions regarding your pre-procedure instructions,  Please  call Pre-admit Testing at [phone]  [facility]

## 2020-10-23 ENCOUNTER — Encounter: Payer: Self-pay | Admitting: Orthopedic Surgery

## 2020-10-23 NOTE — Progress Notes (Signed)
Perioperative Services  Pre-Admission/Anesthesia Testing Clinical Review  Date: 10/23/20  Patient Demographics:  Name: Ryan Hinton DOB:   03/02/1956 MRN:   [id number]  Planned Surgical Procedure(s):    Case: [id number] Date/Time: 10/29/20 1330   Procedure: TOTAL KNEE ARTHROPLASTY (Right Knee)   Anesthesia type: Spinal   Pre-op diagnosis: M17.11 Unilateral primary osteoarthritis, right knee   Location: [facility] OR ROOM 02 / [facility]   Surgeons: Lovell Sheehan, MD    NOTE: Available PAT nursing documentation and vital signs have been reviewed. Clinical nursing staff has updated patient's PMH/PSHx, current medication list, and drug allergies/intolerances to ensure comprehensive history available to assist in medical decision making as it pertains to the aforementioned surgical procedure and anticipated anesthetic course.   Clinical Discussion:  Ryan Hinton is a 65 y.o. male who is submitted for pre-surgical anesthesia review and clearance prior to him undergoing the above procedure. Patient has never been a smoker. Pertinent PMH includes: CAD, NSTEMI, systolic cardiac murmur, CVA,  HTN, T2DM, OSAH (does not require nocturnal PAP therapy), GERD (on daily PPI), OA, chronic back pain.  Patient is followed by cardiology Larey Seat, MD). He was last seen in the cardiology clinic on 09/30/2020; notes reviewed.  At the time of his clinic visit, patient noted to be doing well overall from a cardiovascular perspective. He denied chest pain, shortness of breath, orthopnea, PND, palpitations, peripheral edema, vertiginous symptoms, and presyncope/syncope.  PMH significant for cardiovascular disease as follows:   Patient suffered an NSTEMI in 09/2014.     TTE performed on 10/05/2014 revealed normal left ventricular systolic function with an estimated EF of 60%.     Diagnostic left heart catheterization performed on 10/07/2014 revealed single vessel CAD with occlusion (TIMI 0  antegrade flow) of the mid RCA.  There were other insignificant lesions present.  Patient's catheterization occurred 5 days after presentation, thus medical management, rather than PCTA/PCI, was recommended.     Last TTE performed on 03/26/2020 normal left ventricular systolic function (EF 11%) with mild valvular insufficiency.   Myocardial perfusion imaging study performed on 05/12/2020 revealed an a fixed inferior wall defect suggestive of prior myocardial infarct with no definite associated ischemia.  Overall LVEF was normal at 66% with no evidence of regional wall motion abnormalities.  Patient on GDMT for his HTN and HLD diagnoses.  Blood pressure well controlled at 114/68 on currently prescribed CCB, ARB, and beta-blocker therapies.  Patient is on a statin for his HLD.  Patient reported that his blood sugar readings have been well controlled on currently prescribed regimen; no recent hemoglobin A1c for review.  Functional capacity limited by orthopedic pain, however patient reported to be able to achieve>/= 4 METS.  No changes were made to patient's medication regimen.  Patient was being seen in preoperative consult prior to elective orthopedic procedure.  Decision was made to repeat patient's myocardial perfusion imaging for further risk stratification.  Patient underwent repeat Lexiscan on 10/07/2020 revealing evidence of the previously demonstrated inferior wall defect on both rest and stress imaging.  There was no evidence of acute ischemia; LVEF 74%.  Patient to follow-up with outpatient cardiology at defined intervals for ongoing management.  Patient scheduled to undergo elective orthopedic procedure on 10/29/2020 with Dr. Kurtis Bushman.  Given patient's past medical history significant for cardiovascular disease, presurgical cardiac clearance was sought by PAT team.  Per cardiology, "this patient is optimized for surgery from a cardiovascular perspective.  He may proceed with planned surgical  intervention  at an overall MODERATE risk of perioperative cardiovascular complications".  Cardiologist asking the patient's cardiac status to be monitored closely throughout the perioperative period.  This patient is on daily antiplatelet therapy.  He has been instructed on recommendations for holding his daily low-dose ASA for 7 days prior to his procedure.  Patient has been instructed that his last dose of ASA will be on 10/21/2020.  Patient denies previous perioperative complications with anesthesia in the past. In review of the available records, it is noted that patient underwent a MAC anesthetic course at [facility] (ASA III) in 03/2018 without documented complications.   Vitals with BMI 10/21/2020  Height _0   Weight 248 lbs 11 oz  BMI 32.44  Systolic 010  Diastolic 75  Pulse 92    Providers/Specialists:   NOTE: Primary physician provider listed below. Patient may have been seen by APP or partner within same practice.   PROVIDER ROLE / SPECIALTY LAST Jayme Cloud, MD Orthopedics (Surgeon)  10/21/2020  Girtha Hake, MD Primary Care Provider  09/12/2020  Robbi Garter, MD Cardiology  09/30/2020   Allergies:  Patient has no known allergies.  Current Home Medications:   No current facility-administered medications for this encounter.   Marland Kitchen allopurinol (ZYLOPRIM) 300 MG tablet  . amLODipine (NORVASC) 10 MG tablet  . aspirin EC 81 MG tablet  . dorzolamide-timolol (COSOPT) 22.3-6.8 MG/ML ophthalmic solution  . gabapentin (NEURONTIN) 300 MG capsule  . hydrALAZINE (APRESOLINE) 25 MG tablet  . metFORMIN (GLUCOPHAGE-XR) 500 MG 24 hr tablet  . metoprolol succinate (TOPROL-XL) 25 MG 24 hr tablet  . olmesartan (BENICAR) 40 MG tablet  . OVER THE COUNTER MEDICATION  . pantoprazole (PROTONIX) 40 MG tablet  . sildenafil (VIAGRA) 100 MG tablet  . oxyCODONE-acetaminophen (PERCOCET) 10-325 MG tablet   History:   Past Medical History:  Diagnosis Date  . Acute  anterolateral myocardial infarction ([place]) 09/2014   NSTEMI  . Arthritis   . CAD (coronary artery disease)   . Chronic back pain   . CVA (cerebral vascular accident) ([place])   . Diabetes mellitus without complication ([place])   . GERD (gastroesophageal reflux disease)   . Gout   . Heart murmur, systolic    grade 2/6 to 3/6 present in the aortic area, left sternal border with conduction to carotids  . History of seizures    as a child  . Hypertension   . OSA (obstructive sleep apnea)    does not require nocturnal PAP therapy   Past Surgical History:  Procedure Laterality Date  . BUNIONECTOMY Right   . EYE SURGERY     detached retina  . JOINT REPLACEMENT Left    knee   No family history on file. Social History   Tobacco Use  . Smoking status: Never Smoker  . Smokeless tobacco: Never Used  Vaping Use  . Vaping Use: Never used  Substance Use Topics  . Alcohol use: Not Currently    Comment:  socially in the past  . Drug use: Never    Pertinent Clinical Results:  LABS: Labs reviewed: Acceptable for surgery.  Hospital Outpatient Visit on 10/21/2020  Component Date Value Ref Range Status  . WBC 10/21/2020 14.9* 4.0 - 10.5 K/uL Final  . RBC 10/21/2020 4.61  4.22 - 5.81 MIL/uL Final  . Hemoglobin 10/21/2020 13.7  13.0 - 17.0 g/dL Final  . HCT 10/21/2020 40.8  39.0 - 52.0 % Final  . MCV 10/21/2020 88.5  80.0 - 100.0 fL Final  .  MCH 10/21/2020 29.7  26.0 - 34.0 pg Final  . MCHC 10/21/2020 33.6  30.0 - 36.0 g/dL Final  . RDW 10/21/2020 14.3  11.5 - 15.5 % Final  . Platelets 10/21/2020 242  150 - 400 K/uL Final  . nRBC 10/21/2020 0.0  0.0 - 0.2 % Final   Performed at [facility], [address], [place], [place] [address]  . Sodium 10/21/2020 136  135 - 145 mmol/L Final  . Potassium 10/21/2020 4.3  3.5 - 5.1 mmol/L Final  . Chloride 10/21/2020 101  98 - 111 mmol/L Final  . CO2 10/21/2020 28  22 - 32 mmol/L Final  . Glucose, Bld 10/21/2020 149* 70 - 99 mg/dL Final    Glucose reference range applies only to samples taken after fasting for at least 8 hours.  . BUN 10/21/2020 17  8 - 23 mg/dL Final  . Creatinine, Ser 10/21/2020 0.89  0.61 - 1.24 mg/dL Final  . Calcium 10/21/2020 9.4  8.9 - 10.3 mg/dL Final  . GFR, Estimated 10/21/2020 >60  >60 mL/min Final   Comment: (NOTE) Calculated using the CKD-EPI Creatinine Equation (2021)   . Anion gap 10/21/2020 7  5 - 15 Final   Performed at [facility], [address] [place], [place] [address]  . Prothrombin Time 10/21/2020 12.7  11.4 - 15.2 seconds Final  . INR 10/21/2020 1.0  0.8 - 1.2 Final   Comment: (NOTE) INR goal varies based on device and disease states. Performed at [facility], [address] [place], [place] [address]   . aPTT 10/21/2020 34  24 - 36 seconds Final   Performed at [facility], [address] [place], [place] [address]  . Color, Urine 10/21/2020 YELLOW* YELLOW Final  . APPearance 10/21/2020 CLEAR* CLEAR Final  . Specific Gravity, Urine 10/21/2020 1.029  1.005 - 1.030 Final  . pH 10/21/2020 5.0  5.0 - 8.0 Final  . Glucose, UA 10/21/2020 NEGATIVE  NEGATIVE mg/dL Final  . Hgb urine dipstick 10/21/2020 NEGATIVE  NEGATIVE Final  . Bilirubin Urine 10/21/2020 NEGATIVE  NEGATIVE Final  . Ketones, ur 10/21/2020 NEGATIVE  NEGATIVE mg/dL Final  . Protein, ur 10/21/2020 NEGATIVE  NEGATIVE mg/dL Final  . Nitrite 10/21/2020 NEGATIVE  NEGATIVE Final  . Chalmers Guest 10/21/2020 NEGATIVE  NEGATIVE Final   Performed at [facility], [address], [place], [place] [address]  . MRSA, PCR 10/21/2020 NEGATIVE  NEGATIVE Final  . Staphylococcus aureus 10/21/2020 NEGATIVE  NEGATIVE Final   Comment: (NOTE) The Xpert SA Assay (FDA approved for NASAL specimens in patients 24 years of age and older), is one component of a comprehensive surveillance program. It is not intended to diagnose infection nor to guide or monitor treatment. Performed at [facility], [address], [place], [place] [address]   . ABO/RH(D) 10/21/2020 O POS   Final  . Antibody Screen 10/21/2020 NEG   Final  . Sample Expiration 10/21/2020 11/04/2020,2359   Final  . Extend sample reason 10/21/2020    Final                   Value:NO TRANSFUSIONS OR PREGNANCY IN THE PAST 3 MONTHS Performed at [facility], [place], [place] [address]     ECG: Date: 10/07/2020 Rate: 64 bpm Rhythm: normal sinus ST segment and T wave changes: No evidence of acute ST segment elevation or depression; LVH with evidence of old inferior infarction Comparison: Similar to previous tracing  obtained on 02/17/2020 NOTE: Tracing obtained at [facility]; unable for review. Above based on cardiologist's interpretation.    IMAGING / PROCEDURES: LEXISCAN performed on 10/07/2020 1. LVEF 74% 2. Normal left ventricular systolic function 3. No evidence of regional wall motion abnormalities 4. Inferior wall defect noted in both stress and rest images likely related to soft tissue attenuation versus prior myocardial infarct 5. No significant ST segment abnormalities suggestive of ischemia noted  ECHOCARDIOGRAM performed on 03/26/2020 1. The study was technically difficult with many images being suboptimal in quality. 2. The left ventricle is normal in size. 3. The LV ejection fraction is normal. 4. No obvious regional wall motion abnormalities noted. 5. The right ventricle is normal size. 6. The right ventricular systolic function is normal. 7. Mild aortic regurgitation. 8. There is mild mitral regurgitation. 9. Structurally normal tricuspid valve. 10. There is mild tricuspid regurgitation.   LEFT HEART CATHETERIZATION AND CORONARY ANGIOGRAPHY performed on 10/07/2014 1. Recent inferior MI  He has had no angina in the last 5 days and has a total occlusion of the mid RCA.    Recommendations for medical therapy.    Can always open the mid RCA if fails medical  therapy. 2. Right-sided dominance 3. Left main 10% stenosis 4. LAD is large and wraps the apex.  He has multiple 20 to 30% lesions. 5. 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 OM1 has an ostial 30% stenosis 7. Second diagonal is large 8. Late filling distal PCA from the apical LAD 9. LCx has 30% ostial and proximal disease.  It fills 2 large marginals.  There are some collaterals to the distal RCA. 10. RCA occluded in the mid segment with TIMI 0 antegrade flow.  It has the appearance of a recent occlusion 11. Minimal inferobasilar hypokinesis 12. EF approaching 60% without MR 13. Aortic pressure 110/60 14. LVEDP 12 -14 mmHg   Impression and Plan:  Bravlio Luca has been referred for pre-anesthesia review and clearance prior to him undergoing the planned anesthetic and procedural courses. Available labs, pertinent testing, and imaging results were personally reviewed by me. This patient has been appropriately cleared by cardiology with an overall MODERATE risk of significant perioperative cardiovascular complications.  Based on clinical review performed today (10/23/20), barring any significant acute changes in the patient's overall condition, it is anticipated that he will be able to proceed with the planned surgical intervention. Any acute changes in clinical condition may necessitate his procedure being postponed and/or cancelled. Pre-surgical instructions were reviewed with the patient during his PAT appointment and questions were fielded by PAT clinical staff.  Honor Loh, MSN, APRN, FNP-C, [facility]  Peri-operative Services Nurse Practitioner Phone: [phone] 10/23/20 12:39 PM  NOTE: This note has been prepared using Dragon dictation software. Despite my best ability to proofread, there is always the potential that unintentional transcriptional errors may still occur from this process.

## 2020-10-27 ENCOUNTER — Other Ambulatory Visit
Admission: RE | Admit: 2020-10-27 | Discharge: 2020-10-27 | Disposition: A | Discharge status: Home / Self Care | Payer: Medicare Other | Source: Ambulatory Visit | Attending: Orthopedic Surgery | Admitting: Orthopedic Surgery

## 2020-10-27 NOTE — Pre-Procedure Instructions (Signed)
Patient scheduled for covid test today 10/27/20, this writer called to remind him that he had an appointment today , he voiced that he is 2.5 hrs away and will not be here until tomorrow to get covid test done. This Clinical research associate informed patient that he will need to be here for testing between 8 an and 10 am on 10/28/20, patient voiced that he will be here at 8 am.

## 2020-10-28 ENCOUNTER — Other Ambulatory Visit: Payer: Self-pay

## 2020-10-28 ENCOUNTER — Other Ambulatory Visit
Admission: RE | Admit: 2020-10-28 | Discharge: 2020-10-28 | Disposition: A | Discharge status: Home / Self Care | Payer: Medicare Other | Source: Ambulatory Visit | Attending: Orthopedic Surgery | Admitting: Orthopedic Surgery

## 2020-10-28 DIAGNOSIS — Z01812 Encounter for preprocedural laboratory examination: Secondary | ICD-10-CM | POA: Insufficient documentation

## 2020-10-28 DIAGNOSIS — Z20822 Contact with and (suspected) exposure to covid-19: Secondary | ICD-10-CM | POA: Insufficient documentation

## 2020-10-28 LAB — SARS CORONAVIRUS 2 (TAT 6-24 HRS): SARS Coronavirus 2: NEGATIVE

## 2020-10-29 ENCOUNTER — Encounter
Admission: RE | Disposition: A | Discharge status: Home / Self Care | Payer: Self-pay | Source: Home / Self Care | Attending: Orthopedic Surgery

## 2020-10-29 ENCOUNTER — Inpatient Hospital Stay: Payer: Medicare Other

## 2020-10-29 ENCOUNTER — Other Ambulatory Visit: Payer: Self-pay

## 2020-10-29 ENCOUNTER — Encounter: Payer: Self-pay | Admitting: Orthopedic Surgery

## 2020-10-29 ENCOUNTER — Inpatient Hospital Stay
Admission: RE | Admit: 2020-10-29 | Discharge: 2020-10-31 | DRG: 470 | Disposition: A | Discharge status: Home / Self Care | Payer: Medicare Other | Attending: Orthopedic Surgery | Admitting: Orthopedic Surgery

## 2020-10-29 ENCOUNTER — Inpatient Hospital Stay: Payer: Medicare Other | Admitting: Urgent Care

## 2020-10-29 DIAGNOSIS — Z8673 Personal history of transient ischemic attack (TIA), and cerebral infarction without residual deficits: Secondary | ICD-10-CM | POA: Diagnosis not present

## 2020-10-29 DIAGNOSIS — Z96651 Presence of right artificial knee joint: Secondary | ICD-10-CM

## 2020-10-29 DIAGNOSIS — Z7984 Long term (current) use of oral hypoglycemic drugs: Secondary | ICD-10-CM | POA: Diagnosis not present

## 2020-10-29 DIAGNOSIS — Z79899 Other long term (current) drug therapy: Secondary | ICD-10-CM

## 2020-10-29 DIAGNOSIS — M1711 Unilateral primary osteoarthritis, right knee: Secondary | ICD-10-CM | POA: Diagnosis present

## 2020-10-29 DIAGNOSIS — I2510 Atherosclerotic heart disease of native coronary artery without angina pectoris: Secondary | ICD-10-CM | POA: Diagnosis present

## 2020-10-29 DIAGNOSIS — G4733 Obstructive sleep apnea (adult) (pediatric): Secondary | ICD-10-CM | POA: Diagnosis present

## 2020-10-29 DIAGNOSIS — E119 Type 2 diabetes mellitus without complications: Secondary | ICD-10-CM | POA: Diagnosis present

## 2020-10-29 DIAGNOSIS — I252 Old myocardial infarction: Secondary | ICD-10-CM | POA: Diagnosis not present

## 2020-10-29 DIAGNOSIS — Z419 Encounter for procedure for purposes other than remedying health state, unspecified: Secondary | ICD-10-CM

## 2020-10-29 DIAGNOSIS — K219 Gastro-esophageal reflux disease without esophagitis: Secondary | ICD-10-CM | POA: Diagnosis present

## 2020-10-29 DIAGNOSIS — Z20822 Contact with and (suspected) exposure to covid-19: Secondary | ICD-10-CM | POA: Diagnosis present

## 2020-10-29 DIAGNOSIS — M109 Gout, unspecified: Secondary | ICD-10-CM | POA: Diagnosis present

## 2020-10-29 DIAGNOSIS — I10 Essential (primary) hypertension: Secondary | ICD-10-CM | POA: Diagnosis present

## 2020-10-29 DIAGNOSIS — Z7982 Long term (current) use of aspirin: Secondary | ICD-10-CM

## 2020-10-29 HISTORY — DX: Cardiac murmur, unspecified: R01.1

## 2020-10-29 HISTORY — DX: Other chronic pain: G89.29

## 2020-10-29 HISTORY — DX: Atherosclerotic heart disease of native coronary artery without angina pectoris: I25.10

## 2020-10-29 HISTORY — DX: Personal history of other specified conditions: Z87.898

## 2020-10-29 HISTORY — DX: Cerebral infarction, unspecified: I63.9

## 2020-10-29 HISTORY — DX: Obstructive sleep apnea (adult) (pediatric): G47.33

## 2020-10-29 HISTORY — PX: TOTAL KNEE ARTHROPLASTY: SHX125

## 2020-10-29 LAB — GLUCOSE, CAPILLARY
Glucose-Capillary: 114 mg/dL — ABNORMAL HIGH (ref 70–99)
Glucose-Capillary: 155 mg/dL — ABNORMAL HIGH (ref 70–99)
Glucose-Capillary: 136 mg/dL — ABNORMAL HIGH (ref 70–99)
Glucose-Capillary: 255 mg/dL — ABNORMAL HIGH (ref 70–99)
Glucose-Capillary: 247 mg/dL — ABNORMAL HIGH (ref 70–99)

## 2020-10-29 LAB — ABO/RH: ABO/RH(D): O POS

## 2020-10-29 SURGERY — ARTHROPLASTY, KNEE, TOTAL
Anesthesia: Spinal | Site: Knee | Laterality: Right

## 2020-10-29 MED ORDER — LACTATED RINGERS IV SOLN: INTRAVENOUS | Status: DC

## 2020-10-29 MED ORDER — OXYCODONE HCL 5 MG PO TABS
ORAL_TABLET | ORAL | Status: AC
  Filled 2020-10-29: qty 1

## 2020-10-29 MED ORDER — SUGAMMADEX SODIUM 200 MG/2ML IV SOLN
INTRAVENOUS | Status: DC | PRN
  Administered 2020-10-29 (×2): 200 mg via INTRAVENOUS

## 2020-10-29 MED ORDER — FENTANYL CITRATE (PF) 100 MCG/2ML IJ SOLN
INTRAMUSCULAR | Status: AC
  Filled 2020-10-29: qty 2

## 2020-10-29 MED ORDER — FENTANYL CITRATE (PF) 100 MCG/2ML IJ SOLN
INTRAMUSCULAR | Status: AC
  Administered 2020-10-29: 25 ug via INTRAVENOUS
  Filled 2020-10-29: qty 2

## 2020-10-29 MED ORDER — DEXAMETHASONE SODIUM PHOSPHATE 10 MG/ML IJ SOLN
INTRAMUSCULAR | Status: DC | PRN
  Administered 2020-10-29: 5 mg via INTRAVENOUS

## 2020-10-29 MED ORDER — HYDROMORPHONE HCL 1 MG/ML IJ SOLN
0.5000 mg | INTRAMUSCULAR | Status: DC | PRN
  Administered 2020-10-31: 1 mg via INTRAVENOUS
  Filled 2020-10-29: qty 1

## 2020-10-29 MED ORDER — KETAMINE HCL 10 MG/ML IJ SOLN
INTRAMUSCULAR | Status: DC | PRN
  Administered 2020-10-29: 20 mg via INTRAVENOUS
  Administered 2020-10-29 (×2): 10 mg via INTRAVENOUS

## 2020-10-29 MED ORDER — SODIUM CHLORIDE (PF) 0.9 % IJ SOLN
INTRAMUSCULAR | Status: AC
  Filled 2020-10-29: qty 20

## 2020-10-29 MED ORDER — PROPOFOL 500 MG/50ML IV EMUL
INTRAVENOUS | Status: DC | PRN
  Administered 2020-10-29: 150 mg via INTRAVENOUS
  Administered 2020-10-29: 120 mg via INTRAVENOUS

## 2020-10-29 MED ORDER — OXYCODONE HCL 5 MG PO TABS
5.0000 mg | ORAL_TABLET | ORAL | Status: DC | PRN
  Administered 2020-10-29: 5 mg via ORAL
  Administered 2020-10-31: 10 mg via ORAL
  Filled 2020-10-29 (×2): qty 2

## 2020-10-29 MED ORDER — BISACODYL 10 MG RE SUPP
10.0000 mg | Freq: Every day | RECTAL | Status: DC | PRN
  Administered 2020-10-30: 10 mg via RECTAL
  Filled 2020-10-29: qty 1

## 2020-10-29 MED ORDER — ACETAMINOPHEN 10 MG/ML IV SOLN
INTRAVENOUS | Status: AC
  Filled 2020-10-29: qty 100

## 2020-10-29 MED ORDER — KETOROLAC TROMETHAMINE 15 MG/ML IJ SOLN
15.0000 mg | Freq: Four times a day (QID) | INTRAMUSCULAR | Status: AC
  Administered 2020-10-29 – 2020-10-30 (×4): 15 mg via INTRAVENOUS
  Filled 2020-10-29 (×4): qty 1

## 2020-10-29 MED ORDER — TRANEXAMIC ACID-NACL 1000-0.7 MG/100ML-% IV SOLN: 1000.0000 mg | INTRAVENOUS | Status: DC

## 2020-10-29 MED ORDER — BUPIVACAINE HCL (PF) 0.5 % IJ SOLN
INTRAMUSCULAR | Status: AC
  Filled 2020-10-29: qty 10

## 2020-10-29 MED ORDER — ALLOPURINOL 300 MG PO TABS
300.0000 mg | ORAL_TABLET | Freq: Every day | ORAL | Status: DC
  Administered 2020-10-30 – 2020-10-31 (×2): 300 mg via ORAL
  Filled 2020-10-29 (×2): qty 1

## 2020-10-29 MED ORDER — MAGNESIUM CITRATE PO SOLN
1.0000 | Freq: Once | ORAL | Status: DC | PRN
  Filled 2020-10-29 (×2): qty 296

## 2020-10-29 MED ORDER — ALUM & MAG HYDROXIDE-SIMETH 200-200-20 MG/5ML PO SUSP
30.0000 mL | ORAL | Status: DC | PRN
  Administered 2020-10-30 (×2): 30 mL via ORAL
  Filled 2020-10-29 (×2): qty 30

## 2020-10-29 MED ORDER — MENTHOL 3 MG MT LOZG
1.0000 | LOZENGE | OROMUCOSAL | Status: DC | PRN
  Filled 2020-10-29: qty 9

## 2020-10-29 MED ORDER — METOCLOPRAMIDE HCL 5 MG/ML IJ SOLN: 5.0000 mg | Freq: Three times a day (TID) | INTRAMUSCULAR | Status: DC | PRN

## 2020-10-29 MED ORDER — PANTOPRAZOLE SODIUM 40 MG PO TBEC: 40.0000 mg | DELAYED_RELEASE_TABLET | Freq: Every day | ORAL | Status: DC | PRN

## 2020-10-29 MED ORDER — ONDANSETRON HCL 4 MG/2ML IJ SOLN: 4.0000 mg | Freq: Four times a day (QID) | INTRAMUSCULAR | Status: DC | PRN

## 2020-10-29 MED ORDER — METHOCARBAMOL 500 MG PO TABS
500.0000 mg | ORAL_TABLET | Freq: Four times a day (QID) | ORAL | Status: DC | PRN
  Administered 2020-10-30 – 2020-10-31 (×2): 500 mg via ORAL
  Filled 2020-10-29 (×2): qty 1

## 2020-10-29 MED ORDER — ROCURONIUM BROMIDE 100 MG/10ML IV SOLN
INTRAVENOUS | Status: DC | PRN
  Administered 2020-10-29: 50 mg via INTRAVENOUS
  Administered 2020-10-29 (×2): 20 mg via INTRAVENOUS

## 2020-10-29 MED ORDER — INSULIN ASPART 100 UNIT/ML ~~LOC~~ SOLN
0.0000 [IU] | Freq: Three times a day (TID) | SUBCUTANEOUS | Status: DC
  Administered 2020-10-30: 2 [IU] via SUBCUTANEOUS
  Administered 2020-10-30: 3 [IU] via SUBCUTANEOUS
  Administered 2020-10-30: 2 [IU] via SUBCUTANEOUS
  Administered 2020-10-31: 3 [IU] via SUBCUTANEOUS
  Administered 2020-10-31: 2 [IU] via SUBCUTANEOUS
  Filled 2020-10-29 (×5): qty 1

## 2020-10-29 MED ORDER — CEFAZOLIN SODIUM-DEXTROSE 2-4 GM/100ML-% IV SOLN
2.0000 g | INTRAVENOUS | Status: AC
  Administered 2020-10-29: 2 g via INTRAVENOUS

## 2020-10-29 MED ORDER — BUPIVACAINE HCL (PF) 0.25 % IJ SOLN
INTRAMUSCULAR | Status: DC | PRN
  Administered 2020-10-29: 25 mL

## 2020-10-29 MED ORDER — BUPIVACAINE LIPOSOME 1.3 % IJ SUSP
INTRAMUSCULAR | Status: AC
  Filled 2020-10-29: qty 20

## 2020-10-29 MED ORDER — KETAMINE HCL 50 MG/5ML IJ SOSY
PREFILLED_SYRINGE | INTRAMUSCULAR | Status: AC
  Filled 2020-10-29: qty 5

## 2020-10-29 MED ORDER — ONDANSETRON HCL 4 MG PO TABS: 4.0000 mg | ORAL_TABLET | Freq: Four times a day (QID) | ORAL | Status: DC | PRN

## 2020-10-29 MED ORDER — CHLORHEXIDINE GLUCONATE 0.12 % MT SOLN: 15.0000 mL | Freq: Once | OROMUCOSAL | Status: AC

## 2020-10-29 MED ORDER — DIPHENHYDRAMINE HCL 12.5 MG/5ML PO ELIX: 12.5000 mg | ORAL_SOLUTION | ORAL | Status: DC | PRN

## 2020-10-29 MED ORDER — ACETAMINOPHEN 10 MG/ML IV SOLN: 1000.0000 mg | Freq: Once | INTRAVENOUS | Status: DC | PRN

## 2020-10-29 MED ORDER — MIDAZOLAM HCL 2 MG/2ML IJ SOLN
INTRAMUSCULAR | Status: AC
  Filled 2020-10-29: qty 2

## 2020-10-29 MED ORDER — OXYCODONE HCL 5 MG PO TABS
5.0000 mg | ORAL_TABLET | Freq: Once | ORAL | Status: AC | PRN
Start: 2020-10-29 — End: 2020-10-29
  Administered 2020-10-29: 5 mg via ORAL

## 2020-10-29 MED ORDER — GABAPENTIN 300 MG PO CAPS
300.0000 mg | ORAL_CAPSULE | Freq: Two times a day (BID) | ORAL | Status: DC
  Administered 2020-10-30 – 2020-10-31 (×3): 300 mg via ORAL
  Filled 2020-10-29 (×3): qty 1

## 2020-10-29 MED ORDER — DOCUSATE SODIUM 100 MG PO CAPS
100.0000 mg | ORAL_CAPSULE | Freq: Two times a day (BID) | ORAL | Status: DC
  Administered 2020-10-29 – 2020-10-31 (×4): 100 mg via ORAL
  Filled 2020-10-29 (×4): qty 1

## 2020-10-29 MED ORDER — FENTANYL CITRATE (PF) 100 MCG/2ML IJ SOLN
INTRAMUSCULAR | Status: AC
  Administered 2020-10-29: 50 ug via INTRAVENOUS
  Filled 2020-10-29: qty 2

## 2020-10-29 MED ORDER — OXYCODONE HCL 5 MG PO TABS
10.0000 mg | ORAL_TABLET | ORAL | Status: DC | PRN
  Administered 2020-10-30 (×2): 10 mg via ORAL
  Administered 2020-10-30 – 2020-10-31 (×2): 15 mg via ORAL
  Filled 2020-10-29: qty 2
  Filled 2020-10-29: qty 3
  Filled 2020-10-29: qty 2
  Filled 2020-10-29: qty 3
  Filled 2020-10-29 (×2): qty 2

## 2020-10-29 MED ORDER — SODIUM CHLORIDE 0.9 % IV SOLN
INTRAVENOUS | Status: DC | PRN
  Administered 2020-10-29: 30 ug/min via INTRAVENOUS

## 2020-10-29 MED ORDER — POVIDONE-IODINE 10 % EX SWAB
2.0000 "application " | Freq: Once | CUTANEOUS | Status: AC
  Administered 2020-10-29: 2 via TOPICAL

## 2020-10-29 MED ORDER — PROPOFOL 1000 MG/100ML IV EMUL
INTRAVENOUS | Status: AC
  Filled 2020-10-29: qty 100

## 2020-10-29 MED ORDER — CHLORHEXIDINE GLUCONATE 0.12 % MT SOLN
OROMUCOSAL | Status: AC
  Administered 2020-10-29: 15 mL via OROMUCOSAL
  Filled 2020-10-29: qty 15

## 2020-10-29 MED ORDER — METOCLOPRAMIDE HCL 10 MG PO TABS: 5.0000 mg | ORAL_TABLET | Freq: Three times a day (TID) | ORAL | Status: DC | PRN

## 2020-10-29 MED ORDER — TRANEXAMIC ACID-NACL 1000-0.7 MG/100ML-% IV SOLN
INTRAVENOUS | Status: DC | PRN
  Administered 2020-10-29: 1000 mg via INTRAVENOUS

## 2020-10-29 MED ORDER — SODIUM CHLORIDE 0.9 % IV SOLN
INTRAVENOUS | Status: DC | PRN
  Administered 2020-10-29: 60 mL

## 2020-10-29 MED ORDER — ASPIRIN 81 MG PO CHEW
81.0000 mg | CHEWABLE_TABLET | Freq: Two times a day (BID) | ORAL | Status: DC
  Administered 2020-10-29 – 2020-10-31 (×4): 81 mg via ORAL
  Filled 2020-10-29 (×4): qty 1

## 2020-10-29 MED ORDER — METFORMIN HCL ER 500 MG PO TB24
1000.0000 mg | ORAL_TABLET | Freq: Two times a day (BID) | ORAL | Status: DC
  Administered 2020-10-29 – 2020-10-31 (×4): 1000 mg via ORAL
  Filled 2020-10-29 (×6): qty 2

## 2020-10-29 MED ORDER — CEFAZOLIN SODIUM-DEXTROSE 2-4 GM/100ML-% IV SOLN
INTRAVENOUS | Status: AC
  Filled 2020-10-29: qty 100

## 2020-10-29 MED ORDER — SODIUM CHLORIDE 0.9 % IV SOLN: INTRAVENOUS | Status: DC

## 2020-10-29 MED ORDER — INSULIN ASPART 100 UNIT/ML ~~LOC~~ SOLN
0.0000 [IU] | Freq: Every day | SUBCUTANEOUS | Status: DC
Start: 2020-10-29 — End: 2020-10-31
  Administered 2020-10-29: 2 [IU] via SUBCUTANEOUS
  Filled 2020-10-29: qty 1

## 2020-10-29 MED ORDER — LIDOCAINE HCL (PF) 2 % IJ SOLN
INTRAMUSCULAR | Status: AC
  Filled 2020-10-29: qty 10

## 2020-10-29 MED ORDER — TRANEXAMIC ACID-NACL 1000-0.7 MG/100ML-% IV SOLN
INTRAVENOUS | Status: AC
  Filled 2020-10-29: qty 100

## 2020-10-29 MED ORDER — AMLODIPINE BESYLATE 10 MG PO TABS
10.0000 mg | ORAL_TABLET | Freq: Every day | ORAL | Status: DC
  Administered 2020-10-30 – 2020-10-31 (×2): 10 mg via ORAL
  Filled 2020-10-29 (×2): qty 1

## 2020-10-29 MED ORDER — MIDAZOLAM HCL 5 MG/5ML IJ SOLN
INTRAMUSCULAR | Status: DC | PRN
  Administered 2020-10-29 (×2): 1 mg via INTRAVENOUS

## 2020-10-29 MED ORDER — SODIUM CHLORIDE FLUSH 0.9 % IV SOLN
INTRAVENOUS | Status: AC
  Filled 2020-10-29: qty 40

## 2020-10-29 MED ORDER — HYDRALAZINE HCL 25 MG PO TABS
25.0000 mg | ORAL_TABLET | Freq: Three times a day (TID) | ORAL | Status: DC
  Administered 2020-10-29 – 2020-10-31 (×5): 25 mg via ORAL
  Filled 2020-10-29 (×5): qty 1

## 2020-10-29 MED ORDER — LIDOCAINE HCL (PF) 1 % IJ SOLN
INTRAMUSCULAR | Status: DC | PRN
  Administered 2020-10-29 (×2): 3 mL

## 2020-10-29 MED ORDER — ONDANSETRON HCL 4 MG/2ML IJ SOLN: 4.0000 mg | Freq: Once | INTRAMUSCULAR | Status: DC | PRN

## 2020-10-29 MED ORDER — FENTANYL CITRATE (PF) 100 MCG/2ML IJ SOLN
INTRAMUSCULAR | Status: DC | PRN
  Administered 2020-10-29: 50 ug via INTRAVENOUS
  Administered 2020-10-29: 100 ug via INTRAVENOUS
  Administered 2020-10-29: 50 ug via INTRAVENOUS

## 2020-10-29 MED ORDER — OXYCODONE HCL 5 MG/5ML PO SOLN: 5.0000 mg | Freq: Once | ORAL | Status: AC | PRN

## 2020-10-29 MED ORDER — METHOCARBAMOL 1000 MG/10ML IJ SOLN
500.0000 mg | Freq: Four times a day (QID) | INTRAVENOUS | Status: DC | PRN
  Filled 2020-10-29: qty 5

## 2020-10-29 MED ORDER — SUCCINYLCHOLINE CHLORIDE 20 MG/ML IJ SOLN
INTRAMUSCULAR | Status: DC | PRN
  Administered 2020-10-29: 100 mg via INTRAVENOUS

## 2020-10-29 MED ORDER — ORAL CARE MOUTH RINSE: 15.0000 mL | Freq: Once | OROMUCOSAL | Status: AC

## 2020-10-29 MED ORDER — BUPIVACAINE-EPINEPHRINE (PF) 0.5% -1:200000 IJ SOLN
INTRAMUSCULAR | Status: AC
  Filled 2020-10-29: qty 30

## 2020-10-29 MED ORDER — CEFAZOLIN SODIUM-DEXTROSE 2-4 GM/100ML-% IV SOLN
2.0000 g | Freq: Four times a day (QID) | INTRAVENOUS | Status: AC
  Administered 2020-10-29 – 2020-10-30 (×2): 2 g via INTRAVENOUS
  Filled 2020-10-29 (×2): qty 100

## 2020-10-29 MED ORDER — METOPROLOL SUCCINATE ER 25 MG PO TB24
25.0000 mg | ORAL_TABLET | Freq: Every day | ORAL | Status: DC
  Administered 2020-10-30 – 2020-10-31 (×2): 25 mg via ORAL
  Filled 2020-10-29 (×2): qty 1

## 2020-10-29 MED ORDER — DEXAMETHASONE SODIUM PHOSPHATE 4 MG/ML IJ SOLN
INTRAMUSCULAR | Status: AC
  Filled 2020-10-29: qty 1

## 2020-10-29 MED ORDER — PHENOL 1.4 % MT LIQD
1.0000 | OROMUCOSAL | Status: DC | PRN
  Filled 2020-10-29: qty 177

## 2020-10-29 MED ORDER — ACETAMINOPHEN 325 MG PO TABS
325.0000 mg | ORAL_TABLET | Freq: Four times a day (QID) | ORAL | Status: DC | PRN
Start: 2020-10-30 — End: 2020-10-31
  Administered 2020-10-31 (×2): 650 mg via ORAL
  Filled 2020-10-29 (×2): qty 2

## 2020-10-29 MED ORDER — IRBESARTAN 150 MG PO TABS
300.0000 mg | ORAL_TABLET | Freq: Every day | ORAL | Status: DC
  Administered 2020-10-29 – 2020-10-31 (×3): 300 mg via ORAL
  Filled 2020-10-29 (×3): qty 2

## 2020-10-29 MED ORDER — TRANEXAMIC ACID 1000 MG/10ML IV SOLN
INTRAVENOUS | Status: AC
  Filled 2020-10-29: qty 10

## 2020-10-29 MED ORDER — DORZOLAMIDE HCL-TIMOLOL MAL 2-0.5 % OP SOLN
1.0000 [drp] | Freq: Two times a day (BID) | OPHTHALMIC | Status: DC
  Administered 2020-10-29 – 2020-10-31 (×4): 1 [drp] via OPHTHALMIC
  Filled 2020-10-29: qty 10

## 2020-10-29 MED ORDER — TRAMADOL HCL 50 MG PO TABS
50.0000 mg | ORAL_TABLET | Freq: Four times a day (QID) | ORAL | Status: DC
  Administered 2020-10-29 – 2020-10-31 (×8): 50 mg via ORAL
  Filled 2020-10-29 (×8): qty 1

## 2020-10-29 MED ORDER — FENTANYL CITRATE (PF) 100 MCG/2ML IJ SOLN
25.0000 ug | INTRAMUSCULAR | Status: DC | PRN
  Administered 2020-10-29: 25 ug via INTRAVENOUS
  Administered 2020-10-29: 50 ug via INTRAVENOUS

## 2020-10-29 MED ORDER — BUPIVACAINE-EPINEPHRINE (PF) 0.5% -1:200000 IJ SOLN
INTRAMUSCULAR | Status: DC | PRN
  Administered 2020-10-29: 30 mL

## 2020-10-29 SURGICAL SUPPLY — 64 items
APL PRP STRL LF DISP 70% ISPRP (MISCELLANEOUS) ×2
BASEPLATE TIBIAL SZ 6 (Knees) ×1 IMPLANT
BLADE SAGITTAL AGGR TOOTH XLG (BLADE) ×2 IMPLANT
BLADE SAW SAG 25X90X1.19 (BLADE) ×2 IMPLANT
BOWL CEMENT MIX W/ADAPTER (MISCELLANEOUS) ×2 IMPLANT
BRUSH SCRUB EZ  4% CHG (MISCELLANEOUS) ×2
BRUSH SCRUB EZ 4% CHG (MISCELLANEOUS) ×2 IMPLANT
BSPLAT TIB 6 CMNT M TPR KN RT (Knees) ×1 IMPLANT
CANISTER SUCT 1200ML W/VALVE (MISCELLANEOUS) ×2 IMPLANT
CANISTER SUCT 3000ML PPV (MISCELLANEOUS) ×4 IMPLANT
CEMENT BONE 40GM (Cement) ×4 IMPLANT
CHLORAPREP W/TINT 26 (MISCELLANEOUS) ×4 IMPLANT
COMP PATELLA GENESIS 35MM (Stem) ×2 IMPLANT
COMPONENT FEM OXINIUM RT 6N (Orthopedic Implant) ×2 IMPLANT
COMPONENT PATELLA GENESIS 35MM (Stem) ×1 IMPLANT
COOLER POLAR GLACIER W/PUMP (MISCELLANEOUS) ×2 IMPLANT
COVER WAND RF STERILE (DRAPES) ×2 IMPLANT
CUFF TOURN SGL QUICK 30 (TOURNIQUET CUFF) ×2
CUFF TRNQT CYL 30X4X21-28X (TOURNIQUET CUFF) ×1 IMPLANT
DRAPE 3/4 80X56 (DRAPES) ×4 IMPLANT
DRAPE INCISE IOBAN 66X60 STRL (DRAPES) ×2 IMPLANT
ELECT REM PT RETURN 9FT ADLT (ELECTROSURGICAL) ×2
ELECTRODE REM PT RTRN 9FT ADLT (ELECTROSURGICAL) ×1 IMPLANT
GAUZE SPONGE 4X4 12PLY STRL (GAUZE/BANDAGES/DRESSINGS) ×2 IMPLANT
GAUZE XEROFORM 1X8 LF (GAUZE/BANDAGES/DRESSINGS) ×2 IMPLANT
GLOVE SURG ENC MOIS LTX SZ8 (GLOVE) ×2 IMPLANT
GLOVE SURG ORTHO LTX SZ8 (GLOVE) ×6 IMPLANT
GLOVE SURG UNDER LTX SZ8 (GLOVE) ×2 IMPLANT
GLOVE SURG UNDER POLY LF SZ8.5 (GLOVE) ×2 IMPLANT
GOWN STRL REUS W/ TWL LRG LVL3 (GOWN DISPOSABLE) ×1 IMPLANT
GOWN STRL REUS W/ TWL XL LVL3 (GOWN DISPOSABLE) ×1 IMPLANT
GOWN STRL REUS W/TWL LRG LVL3 (GOWN DISPOSABLE) ×2
GOWN STRL REUS W/TWL XL LVL3 (GOWN DISPOSABLE) ×2
HOOD PEEL AWAY FLYTE STAYCOOL (MISCELLANEOUS) ×6 IMPLANT
INSERT ART HI FLEX 9 SZ5-6 (Insert) ×2 IMPLANT
IRRIGATION SURGIPHOR STRL (IV SOLUTION) ×2 IMPLANT
IV NS 1000ML (IV SOLUTION) ×2
IV NS 1000ML BAXH (IV SOLUTION) ×1 IMPLANT
KIT TURNOVER KIT A (KITS) ×2 IMPLANT
MANIFOLD NEPTUNE II (INSTRUMENTS) ×2 IMPLANT
MAT ABSORB  FLUID 56X50 GRAY (MISCELLANEOUS) ×1
MAT ABSORB FLUID 56X50 GRAY (MISCELLANEOUS) ×1 IMPLANT
NDL SAFETY ECLIPSE 18X1.5 (NEEDLE) ×1 IMPLANT
NEEDLE HYPO 18GX1.5 SHARP (NEEDLE) ×2
NEEDLE SPNL 20GX3.5 QUINCKE YW (NEEDLE) ×2 IMPLANT
NS IRRIG 1000ML POUR BTL (IV SOLUTION) ×2 IMPLANT
PACK TOTAL KNEE (MISCELLANEOUS) ×2 IMPLANT
PAD DE MAYO PRESSURE PROTECT (MISCELLANEOUS) ×2 IMPLANT
PAD WRAPON POLAR KNEE (MISCELLANEOUS) ×1 IMPLANT
PULSAVAC PLUS IRRIG FAN TIP (DISPOSABLE) ×2
SPONGE LAP 18X18 RF (DISPOSABLE) ×2 IMPLANT
STAPLER SKIN PROX 35W (STAPLE) ×2 IMPLANT
SUCTION FRAZIER HANDLE 10FR (MISCELLANEOUS) ×1
SUCTION TUBE FRAZIER 10FR DISP (MISCELLANEOUS) ×1 IMPLANT
SUT DVC 2 QUILL PDO  T11 36X36 (SUTURE) ×1
SUT DVC 2 QUILL PDO T11 36X36 (SUTURE) ×1 IMPLANT
SUT VIC AB 2-0 CT1 18 (SUTURE) ×2 IMPLANT
SUT VIC AB 2-0 CT1 27 (SUTURE)
SUT VIC AB 2-0 CT1 TAPERPNT 27 (SUTURE) IMPLANT
SUT VIC AB PLUS 45CM 1-MO-4 (SUTURE) ×2 IMPLANT
SYR 30ML LL (SYRINGE) ×6 IMPLANT
TIBIAL BASEPLATE SZ 6 (Knees) ×2 IMPLANT
TIP FAN IRRIG PULSAVAC PLUS (DISPOSABLE) ×1 IMPLANT
WRAPON POLAR PAD KNEE (MISCELLANEOUS) ×2

## 2020-10-29 NOTE — Anesthesia Procedure Notes (Addendum)
Procedure Name: Intubation Date/Time: 10/29/2020 2:08 PM Performed by: Jerrye Noble, CRNA Pre-anesthesia Checklist: Patient identified, Emergency Drugs available, Suction available and Patient being monitored Patient Re-evaluated:Patient Re-evaluated prior to induction Oxygen Delivery Method: Circle system utilized Preoxygenation: Pre-oxygenation with 100% oxygen Induction Type: IV induction Ventilation: Mask ventilation without difficulty and Oral airway inserted - appropriate to patient size Laryngoscope Size: Mac and 4 Grade View: Grade I Tube type: Oral Tube size: 7.0 mm Number of attempts: 1 Airway Equipment and Method: Stylet,  Oral airway and Video-laryngoscopy Placement Confirmation: ETT inserted through vocal cords under direct vision,  positive ETCO2 and breath sounds checked- equal and bilateral Secured at: 23 cm Tube secured with: Tape Dental Injury: Teeth and Oropharynx as per pre-operative assessment

## 2020-10-29 NOTE — Anesthesia Preprocedure Evaluation (Addendum)
Anesthesia Evaluation  Patient identified by MRN, date of birth, ID band Patient awake    Reviewed: Allergy & Precautions, NPO status , Patient's Chart, lab work & pertinent test results  History of Anesthesia Complications Negative for: history of anesthetic complications  Airway Mallampati: II  TM Distance: >3 FB Neck ROM: Full    Dental  (+) Poor Dentition, Missing   Pulmonary sleep apnea , neg COPD, Patient abstained from smoking.Not current smoker,  Does not use cpap   Pulmonary exam normal breath sounds clear to auscultation       Cardiovascular Exercise Tolerance: Poor METShypertension, + CAD and + Past MI  (-) dysrhythmias  Rhythm:Regular Rate:Normal - Systolic murmurs 3244 nuclear stress test: The myocardial perfusion imaging study shows no definite evidence of  ischemia. Inferior wall defect noted in both stress and rest images,  likely related to soft tissue attenuation versus prior myocardial infarct   Overall, LVEF is normal at 74% with no wall motion abnormality. No  evidence of transient ischemic dilatation of the left ventricle     Neuro/Psych CVA, No Residual Symptoms negative psych ROS   GI/Hepatic GERD  ,(+)     (-) substance abuse  ,   Endo/Other  diabetesHyperthyroidism   Renal/GU negative Renal ROS     Musculoskeletal   Abdominal   Peds  Hematology   Anesthesia Other Findings Past Medical History: No date: Arthritis No date: CAD (coronary artery disease) No date: Chronic back pain No date: CVA (cerebral vascular accident) (HCC) No date: Diabetes mellitus without complication (HCC) No date: GERD (gastroesophageal reflux disease) No date: Gout No date: Heart murmur, systolic     Comment:  grade 2/6 to 3/6 present in the aortic area, left               sternal border with conduction to carotids No date: History of seizures     Comment:  as a child No date: Hypertension 09/2014:  NSTEMI (non-ST elevated myocardial infarction) (HCC) No date: OSA (obstructive sleep apnea)     Comment:  does not require nocturnal PAP therapy  Reproductive/Obstetrics                            Anesthesia Physical Anesthesia Plan  ASA: III  Anesthesia Plan: General/Spinal   Post-op Pain Management:    Induction: Intravenous  PONV Risk Score and Plan: 2 and Ondansetron, Dexamethasone, Propofol infusion, TIVA, Midazolam and Treatment may vary due to age or medical condition  Airway Management Planned: Natural Airway  Additional Equipment: None  Intra-op Plan:   Post-operative Plan:   Informed Consent: I have reviewed the patients History and Physical, chart, labs and discussed the procedure including the risks, benefits and alternatives for the proposed anesthesia with the patient or authorized representative who has indicated his/her understanding and acceptance.       Plan Discussed with: CRNA and Surgeon  Anesthesia Plan Comments: (Discussed R/B/A of neuraxial anesthesia technique with patient: - rare risks of spinal/epidural hematoma, nerve damage, infection - Risk of PDPH - Risk of nausea and vomiting - Risk of conversion to general anesthesia and its associated risks, including sore throat, damage to lips/teeth/oropharynx, and rare risks such as cardiac and respiratory events.  Discussed r/b/a of adductor canal nerve block (likely post op), including:  - bleeding, infection, nerve damage - poor or non functioning block. Patient understands.  )        Anesthesia Quick Evaluation

## 2020-10-29 NOTE — Op Note (Signed)
DATE OF SURGERY:  10/29/2020 TIME: 3:51 PM  PATIENT NAME:  Ryan Hinton   AGE: 65 y.o.    PRE-OPERATIVE DIAGNOSIS:  M17.11 Unilateral primary osteoarthritis, right knee  POST-OPERATIVE DIAGNOSIS:  Same  PROCEDURE:  Procedure(s): TOTAL KNEE ARTHROPLASTY, RIGHT   SURGEON:  Lyndle Herrlich, MD   ASSISTANT:  Altamese Cabal,  PA-C  OPERATIVE IMPLANTS: [employer], Cruciate Retaining Oxinium Femoral component size  6 narrow, Fixed Bearing Tray size 6, Patella polyethylene 3-peg oval button size 35 mm, with a 9 mm HighFlex insert.   PREOPERATIVE INDICATIONS:  Ryan Hinton is an 65 y.o. male who has a diagnosis of M17.11 Unilateral primary osteoarthritis, right knee and elected for a total knee arthroplasty after failing nonoperative treatment, including activity modification, pain medication, physical therapy and injections who has significant impairment of their activities of daily living.  Radiographs have demonstrated tricompartmental osteoarthritis joint space narrowing, osteophytes, subchondral sclerosis and cyst formation.  The risks, benefits, and alternatives were discussed at length including but not limited to the risks of infection, bleeding, nerve or blood vessel injury, knee stiffness, fracture, dislocation, loosening or failure of the hardware and the need for further surgery. Medical risks include but not limited to DVT and pulmonary embolism, myocardial infarction, stroke, pneumonia, respiratory failure and death. I discussed these risks with the patient in my office prior to the date of surgery. They understood these risks and were willing to proceed.  OPERATIVE FINDINGS AND UNIQUE ASPECTS OF THE CASE:  All three compartments with advanced and severe degenerative changes, large osteophytes and an abundance of synovial fluid. Significant deformity was also noted. A decision was made to proceed with total knee arthroplasty.   OPERATIVE DESCRIPTION:  The patient was  brought to the operative room and placed in a supine position after undergoing placement of a general anesthetic. IV antibiotics were given. Patient received tranexamic acid. The lower extremity was prepped and draped in the usual sterile fashion.  A time out was performed to verify the patient's name, date of birth, medical record number, correct site of surgery and correct procedure to be performed. The timeout was also used to confirm the patient received antibiotics and that appropriate instruments, implants and radiographs studies were available in the room.  The leg was elevated and exsanguinated with an Esmarch and the tourniquet was inflated to 275 mmHg.  A midline incision was made over the left knee.. A medial parapatellar arthrotomy was then made and the patella subluxed laterally and the knee was brought into 90 of flexion. Hoffa's fat pad along with the anterior cruciate ligament was resected and the medial joint line was exposed.  Attention was then turned to preparation of the patella. The thickness of the patella was measured with a caliper, the diameter measured with the patella templates.  The patella resection was then made with an oscillating saw using the patella cutting guide.  The 35 mm button fit appropriately.  3 peg holes for the patella component were then drilled.  The extramedullary tibial cutting guide was then placed using the anterior tibial crest and second ray of the foot as a reference.  The tibial cutting guide was adjusted to allow for appropriate posterior slope.  The tibial cutting block was pinned into position. The slotted stylus was used to measure the proximal tibial resection of 9 mm off the high lateral side. Care was taken during the tibial resection to protect the medial and collateral ligaments.  The resected tibial bone was removed.  The distal femur was resected using the Visionaire cutting guide.  Care was taken to protect the collateral ligaments during  distal femoral resection.  The distal femoral resection was performed with an oscillating saw. The femoral cutting guide was then removed. Extension gap was measured with a 9 mm spacer block and alignment and extension was confirmed using a long alignment rod. The femur was sized to be a 6 narrow. Rotation of the referencing guide was checked with the epicondylar axis and Whitesides line. Then the 4-in-1 cutting jig was then applied to the distal femur. A stylus was used to confirm that the anterior femur would not be notched.   Then the anterior, posterior and chamfer femoral cuts were then made with an oscillating saw.  The knee was distracted and all posterior osteophytes were removed.  The flexion gap was then measured with a flexion spacer block and long alignment rod and was found to be symmetric with the extension gap and perpendicular to mechanical axis of the tibia.  The proximal tibia plateau was then sized with trial trays. The best coverage was achieved with a size 6. This tibial tray was then pinned into position. The proximal tibia was then prepared with the keel punch.  After tibial preparation was completed, all trial components were inserted with polyethylene trials. The knee achieved full extension and flexed to 120 degrees. Ligament were stable to varus and valgus at full extension as well as 30, 60 and 90 degrees of flexion.   The trials were then placed. Knee was taken through a full range of motion and deemed to be stable with the trial components. All trial components were then removed.  The joint was copiously irrigated with pulse lavage.  The final total knee arthroplasty components were then cemented into place. The knee was held in extension while cement was allowed to cure.The knee was taken through a range of motion and the patella tracked well and the knee was again irrigated copiously.  The knee capsule was then injected with Exparel.  The medial arthrotomy was closed with #1  Vicryl and #2 Quill. The subcutaneous tissue closed with  2-0 vicryl, and skin approximated with staples.  A dry sterile and compressive dressing was applied.  A Polar Care was applied to the operative knee.  The patient was awakened and brought to the PACU in stable and satisfactory condition.  All sharp, lap and instrument counts were correct at the conclusion the case. I spoke with the patient's family in the postop consultation room to let them know the case had been performed without complication and the patient was stable in recovery room.   Total tourniquet time was 57 minutes.

## 2020-10-29 NOTE — Plan of Care (Signed)
  Problem: Education: Goal: Knowledge of General Education information will improve Description: Including pain rating scale, medication(s)/side effects and non-pharmacologic comfort measures Outcome: Progressing   Problem: Health Behavior/Discharge Planning: Goal: Ability to manage health-related needs will improve Outcome: Progressing   Problem: Clinical Measurements: Goal: Ability to maintain clinical measurements within normal limits will improve Outcome: Progressing Goal: Will remain free from infection Outcome: Progressing Goal: Diagnostic test results will improve Outcome: Progressing Goal: Respiratory complications will improve Outcome: Progressing Goal: Cardiovascular complication will be avoided Outcome: Progressing   Problem: Activity: Goal: Risk for activity intolerance will decrease Outcome: Progressing   Problem: Nutrition: Goal: Adequate nutrition will be maintained Outcome: Progressing   Problem: Elimination: Goal: Will not experience complications related to bowel motility Outcome: Progressing Goal: Will not experience complications related to urinary retention Outcome: Progressing   Problem: Pain Managment: Goal: General experience of comfort will improve Outcome: Progressing   Problem: Skin Integrity: Goal: Risk for impaired skin integrity will decrease Outcome: Progressing   Problem: Education: Goal: Knowledge of the prescribed therapeutic regimen will improve Outcome: Progressing Goal: Individualized Educational Video(s) Outcome: Progressing   Problem: Activity: Goal: Ability to avoid complications of mobility impairment will improve Outcome: Progressing Goal: Range of joint motion will improve Outcome: Progressing   Problem: Clinical Measurements: Goal: Postoperative complications will be avoided or minimized Outcome: Progressing

## 2020-10-29 NOTE — Anesthesia Postprocedure Evaluation (Signed)
Anesthesia Post Note  Patient: Aundray Cartlidge  Procedure(s) Performed: TOTAL KNEE ARTHROPLASTY (Right Knee)  Patient location during evaluation: PACU Anesthesia Type: Combined General/Spinal Level of consciousness: awake and alert Pain management: pain level controlled Vital Signs Assessment: post-procedure vital signs reviewed and stable Respiratory status: spontaneous breathing, nonlabored ventilation, respiratory function stable and patient connected to nasal cannula oxygen Cardiovascular status: blood pressure returned to baseline and stable Postop Assessment: no apparent nausea or vomiting Anesthetic complications: no   No complications documented.   Last Vitals:  Vitals:   10/29/20 1715 10/29/20 1730  BP: (!) 142/80 (!) 152/77  Pulse: 74 69  Resp: 17 17  Temp: 36.9 C   SpO2: 96% 97%    Last Pain:  Vitals:   10/29/20 1730  TempSrc:   PainSc: 5                  Yevette Edwards

## 2020-10-29 NOTE — Transfer of Care (Signed)
Immediate Anesthesia Transfer of Care Note  Patient: Ryan Hinton  Procedure(s) Performed: TOTAL KNEE ARTHROPLASTY (Right Knee)  Patient Location: PACU  Anesthesia Type:General  Level of Consciousness: awake, drowsy and patient cooperative  Airway & Oxygen Therapy: Patient Spontanous Breathing and Patient connected to face mask oxygen  Post-op Assessment: Report given to RN and Post -op Vital signs reviewed and stable  Post vital signs: Reviewed and stable  Last Vitals:  Vitals Value Taken Time  BP    Temp    Pulse 68 10/29/20 1615  Resp 14 10/29/20 1615  SpO2 100 % 10/29/20 1615  Vitals shown include unvalidated device data.  Last Pain:  Vitals:   10/29/20 1212  TempSrc: Temporal  PainSc: 6          Complications: No complications documented.

## 2020-10-29 NOTE — H&P (Signed)
The patient has been re-examined, and the chart reviewed, and there have been no interval changes to the documented history and physical.  Plan a right total knee today.  Anesthesia is consulted regarding a peripheral nerve block for post-operative pain.  The risks, benefits, and alternatives have been discussed at length, and the patient is willing to proceed.     

## 2020-10-29 NOTE — Anesthesia Procedure Notes (Signed)
Anesthesia Regional Block: Adductor canal block   Pre-Anesthetic Checklist: ,, timeout performed, Correct Patient, Correct Site, Correct Laterality, Correct Procedure, Correct Position, site marked, Risks and benefits discussed,  Surgical consent,  Pre-op evaluation,  At surgeon's request and post-op pain management  Laterality: Right  Prep: chloraprep       Needles:  Injection technique: Single-shot  Needle Type: Echogenic Stimulator Needle     Needle Length: 9cm  Needle Gauge: 21     Additional Needles:   Procedures:,,,, ultrasound used (permanent image in chart),,,,  Narrative:  Start time: 10/29/2020 4:55 PM End time: 10/29/2020 5:08 PM Injection made incrementally with aspirations every 5 mL.  Performed by: Personally  Anesthesiologist: Piscitello, Cleda Mccreedy, MD  Additional Notes: Patient consented for risk and benefits of nerve block including but not limited to nerve damage, failed block, bleeding and infection.  Patient voiced understanding.  Functioning IV was confirmed and monitors were applied.  Timeout done prior to procedure and prior to any sedation being given to the patient.  Patient confirmed procedure site prior to any sedation given to the patient.. Sterile prep,hand hygiene and sterile gloves were used.  Minimal sedation used for procedure.  No paresthesia endorsed by patient during the procedure.  Negative aspiration and negative test dose prior to incremental administration of local anesthetic. The patient tolerated the procedure well with no immediate complications.

## 2020-10-30 LAB — HEMOGLOBIN A1C
Hgb A1c MFr Bld: 7.2 % — ABNORMAL HIGH (ref 4.8–5.6)
Mean Plasma Glucose: 159.94 mg/dL

## 2020-10-30 LAB — CBC
HCT: 36.7 % — ABNORMAL LOW (ref 39.0–52.0)
Hemoglobin: 12.7 g/dL — ABNORMAL LOW (ref 13.0–17.0)
MCH: 30.2 pg (ref 26.0–34.0)
MCHC: 34.6 g/dL (ref 30.0–36.0)
MCV: 87.4 fL (ref 80.0–100.0)
Platelets: 196 10*3/uL (ref 150–400)
RBC: 4.2 MIL/uL — ABNORMAL LOW (ref 4.22–5.81)
RDW: 13.9 % (ref 11.5–15.5)
WBC: 18.1 10*3/uL — ABNORMAL HIGH (ref 4.0–10.5)
nRBC: 0 % (ref 0.0–0.2)

## 2020-10-30 LAB — GLUCOSE, CAPILLARY
Glucose-Capillary: 139 mg/dL — ABNORMAL HIGH (ref 70–99)
Glucose-Capillary: 189 mg/dL — ABNORMAL HIGH (ref 70–99)
Glucose-Capillary: 139 mg/dL — ABNORMAL HIGH (ref 70–99)
Glucose-Capillary: 180 mg/dL — ABNORMAL HIGH (ref 70–99)

## 2020-10-30 LAB — BASIC METABOLIC PANEL
Anion gap: 8 (ref 5–15)
BUN: 17 mg/dL (ref 8–23)
CO2: 26 mmol/L (ref 22–32)
Calcium: 9.1 mg/dL (ref 8.9–10.3)
Chloride: 99 mmol/L (ref 98–111)
Creatinine, Ser: 1.01 mg/dL (ref 0.61–1.24)
GFR, Estimated: >60 mL/min (ref >60)
Glucose, Bld: 167 mg/dL — ABNORMAL HIGH (ref 70–99)
Potassium: 4.6 mmol/L (ref 3.5–5.1)
Sodium: 133 mmol/L — ABNORMAL LOW (ref 135–145)

## 2020-10-30 NOTE — Evaluation (Signed)
Physical Therapy Evaluation Patient Details Name: Ryan Hinton MRN: [id number] DOB: Jul 06, 1956 Today's Date: 10/30/2020   History of Present Illness  Pt is a 65 y.o. male s/p R TKA 4/6 secondary unilateral primary OA.  PMH includes sleep apnea, htn, h/o MI, CAD, CVA, DM, chronic back pain, gout, h/o seizures as a child, htn, NSTEMI.  Pt also reports R eye impairment limiting vision.  Clinical Impression  Prior to hospital admission, pt was independent with ambulation (limited household distances d/t R knee pain though); is planning to stay with his girlfriend upon hospital discharge (1 level home with 1 STE) and reports having OP PT already set up.  Pt reports h/o L knee replacement but did not have any therapy after it and was eager to work with therapy after this surgery because he had a difficult time without therapy last surgery.  Currently pt is SBA semi-supine to sitting edge of bed; min assist progressing to CGA with transfers; and CGA ambulating 50 feet with RW.  Vc's required for transfer and gait technique.  R knee flexion AAROM to 85 degrees.  Pain 3/10 R knee pain beginning/end of session at rest.  Pt would benefit from skilled PT to address noted impairments and functional limitations (see below for any additional details).  Upon hospital discharge, pt would benefit from OP PT.    Follow Up Recommendations Outpatient PT     Equipment Recommendations  Rolling walker with 5" wheels;3in1 (PT) (pt reports having RW and BSC already)    Recommendations for Other Services OT consult     Precautions / Restrictions Precautions Precautions: Fall Restrictions Weight Bearing Restrictions: Yes RLE Weight Bearing: Weight bearing as tolerated      Mobility  Bed Mobility Overal bed mobility: Needs Assistance Bed Mobility: Supine to Sit     Supine to sit: Supervision;HOB elevated     General bed mobility comments: mild increased time to perform on own; vc's for technique     Transfers Overall transfer level: Needs assistance Equipment used: Rolling walker (2 wheeled) Transfers: Sit to/from [employer] Sit to Stand: Min assist;Min guard Stand pivot transfers: Min guard (stand step turn bed to recliner with RW)       General transfer comment: min assist to stand from bed and CGA to stand from recliner; vc's for UE/LE placement and overall technique  Ambulation/Gait Ambulation/Gait assistance: Min guard Gait Distance (Feet): 50 Feet Assistive device: Rolling walker (2 wheeled)   Gait velocity: decreased   General Gait Details: antalgic; decreased stance time R LE; decreased B LE step length; step to gait pattern; intermittent vc's for overall gait technique and positioning within walker  Stairs            Wheelchair Mobility    Modified Rankin (Stroke Patients Only)       Balance Overall balance assessment: Needs assistance Sitting-balance support: No upper extremity supported;Feet supported Sitting balance-Leahy Scale: Normal Sitting balance - Comments: steady sitting reaching outside BOS   Standing balance support: Single extremity supported Standing balance-Leahy Scale: Fair Standing balance comment: steady standing with at least single UE support                             Pertinent Vitals/Pain Pain Assessment: 0-10 Pain Score: 3  Pain Location: R knee Pain Descriptors / Indicators: Sore Pain Intervention(s): Limited activity within patient's tolerance;Monitored during session;Premedicated before session;Repositioned;Other (comment) (polar care applied and activated)  Vitals (HR and  O2 on room air) stable and WFL throughout treatment session.    Home Living Family/patient expects to be discharged to:: Private residence Living Arrangements: Alone (staying with girlfriend after surgery) Available Help at Discharge: Friend(s) (girlfriend (who works)) Type of Home: House Home Access: Stairs to  enter Entrance Stairs-Rails: None Secretary/administrator of Steps: 1 Home Layout: One level Home Equipment: Environmental consultant - 2 wheels;Walker - 4 wheels;Cane - single point;Bedside commode;Grab bars - tub/shower      Prior Function Level of Independence: Independent         Comments: Pt reports no falls in past 6 months but has been limited with household mobility d/t R knee pain.     Hand Dominance        Extremity/Trunk Assessment   Upper Extremity Assessment Upper Extremity Assessment: Overall WFL for tasks assessed    Lower Extremity Assessment Lower Extremity Assessment: RLE deficits/detail (L LE WFL) RLE Deficits / Details: good R quad set strength; at least 3/5 AROM ankle DF/PF and hip flexion AROM RLE: Unable to fully assess due to pain    Cervical / Trunk Assessment Cervical / Trunk Assessment: Normal  Communication   Communication: No difficulties  Cognition Arousal/Alertness: Awake/alert Behavior During Therapy: WFL for tasks assessed/performed Overall Cognitive Status: Within Functional Limits for tasks assessed                                        General Comments   Nursing cleared pt for participation in physical therapy.  Pt agreeable to PT session and appearing eager to get OOB.    Exercises Total Joint Exercises Ankle Circles/Pumps: AROM;Strengthening;Both;10 reps;Supine Quad Sets: AROM;Strengthening;Both;Supine (10 reps x2) Short Arc Quad: AAROM;Strengthening;Right;10 reps;Supine Heel Slides: AAROM;Strengthening;Right;10 reps;Supine Hip ABduction/ADduction: AAROM;Strengthening;Right;10 reps;Supine Straight Leg Raises: AAROM;Strengthening;Right;10 reps;Supine Goniometric ROM: R knee 8 degrees short of neutral extension semi-supine in bed; R knee flexion 85 degrees AAROM in sitting   Assessment/Plan    PT Assessment Patient needs continued PT services  PT Problem List Decreased strength;Decreased range of motion;Decreased activity  tolerance;Decreased balance;Decreased mobility;Decreased knowledge of use of DME;Decreased knowledge of precautions;Pain;Decreased skin integrity       PT Treatment Interventions DME instruction;Gait training;Stair training;Functional mobility training;Therapeutic activities;Therapeutic exercise;Balance training;Patient/family education    PT Goals (Current goals can be found in the Care Plan section)  Acute Rehab PT Goals Patient Stated Goal: to go home PT Goal Formulation: With patient Time For Goal Achievement: 11/13/20 Potential to Achieve Goals: Good    Frequency BID   Barriers to discharge        Co-evaluation               AM-PAC PT "6 Clicks" Mobility  Outcome Measure Help needed turning from your back to your side while in a flat bed without using bedrails?: None Help needed moving from lying on your back to sitting on the side of a flat bed without using bedrails?: A Little Help needed moving to and from a bed to a chair (including a wheelchair)?: A Little Help needed standing up from a chair using your arms (e.g., wheelchair or bedside chair)?: A Little Help needed to walk in hospital room?: A Little Help needed climbing 3-5 steps with a railing? : A Lot 6 Click Score: 18    End of Session Equipment Utilized During Treatment: Gait belt Activity Tolerance: Patient tolerated treatment well Patient left: in chair;with call  bell/phone within reach;with chair alarm set;with SCD's reapplied;Other (comment) (R heel floating via towel roll; L heel floating via pillow support) Nurse Communication: Mobility status;Precautions;Weight bearing status;Other (comment) (pt's pain status) PT Visit Diagnosis: Other abnormalities of gait and mobility (R26.89);Muscle weakness (generalized) (M62.81);Difficulty in walking, not elsewhere classified (R26.2);Pain Pain - Right/Left: Right Pain - part of body: Knee    Time: 3500-9381 PT Time Calculation (min) (ACUTE ONLY): 57  min   Charges:   PT Evaluation $PT Eval Low Complexity: 1 Low PT Treatments $Gait Training: 8-22 mins $Therapeutic Exercise: 8-22 mins $Therapeutic Activity: 8-22 mins       Hendricks Limes, PT 10/30/20, 10:33 AM

## 2020-10-30 NOTE — Plan of Care (Signed)
  Problem: Education: Goal: Knowledge of General Education information will improve Description: Including pain rating scale, medication(s)/side effects and non-pharmacologic comfort measures Outcome: Progressing   Problem: Clinical Measurements: Goal: Will remain free from infection Outcome: Progressing   Problem: Clinical Measurements: Goal: Respiratory complications will improve Outcome: Progressing   Problem: Clinical Measurements: Goal: Cardiovascular complication will be avoided Outcome: Progressing   Problem: Elimination: Goal: Will not experience complications related to bowel motility Outcome: Progressing   Problem: Pain Managment: Goal: General experience of comfort will improve Outcome: Progressing   Problem: Safety: Goal: Ability to remain free from injury will improve Outcome: Progressing

## 2020-10-30 NOTE — Evaluation (Signed)
Occupational Therapy Evaluation Patient Details Name: Ryan Hinton MRN: [id number] DOB: [birth year] Today's Date: 10/30/2020    History of Present Illness Pt is a 65 y.o. male s/p R TKA 4/6 secondary unilateral primary OA.  PMH includes sleep apnea, htn, h/o MI, CAD, CVA, DM, chronic back pain, gout, h/o seizures as a child, htn, NSTEMI.  Pt also reports R eye impairment limiting vision.   Clinical Impression   Ryan Hinton was seen for OT evaluation this date, POD#1 from above surgery. Pt was independent in all ADLs prior to surgery. Pt currently requires MOD I doff L sock seated EOB, MIN A doff R sock. CGA + RW for ADL t/f and grooming standing sinkside. Pt requires MIN A sit>sup, assist for RLE. Reports 5/10 R knee pain t/o session. Pt instructed in polar care mgt, falls prevention strategies, home/routines modifications, DME/AE for LB bathing and dressing tasks, and compression stocking mgt. Pt would benefit from skilled OT services including additional instruction in dressing techniques with or without assistive devices for dressing and bathing skills to support recall and carryover prior to discharge and ultimately to maximize safety, independence, and minimize falls risk and caregiver burden. Do not currently anticipate any OT needs following this hospitalization.       Follow Up Recommendations  No OT follow up    Equipment Recommendations  None recommended by OT    Recommendations for Other Services       Precautions / Restrictions Precautions Precautions: Fall Restrictions Weight Bearing Restrictions: Yes RLE Weight Bearing: Weight bearing as tolerated      Mobility Bed Mobility Overal bed mobility: Needs Assistance Bed Mobility: Supine to Sit;Sit to Supine     Supine to sit: Supervision;HOB elevated Sit to supine: Min assist   General bed mobility comments: assist for RLE    Transfers Overall transfer level: Needs assistance Equipment used: Rolling walker (2  wheeled) Transfers: Sit to/from Stand Sit to Stand: Min guard           Balance Overall balance assessment: Needs assistance Sitting-balance support: No upper extremity supported;Feet supported Sitting balance-Leahy Scale: Normal Sitting balance - Comments: steady sitting reaching outside BOS   Standing balance support: No upper extremity supported Standing balance-Leahy Scale: Fair Standing balance comment: steady dynamic standing no UE support                           ADL either performed or assessed with clinical judgement   ADL Overall ADL's : Needs assistance/impaired                                       General ADL Comments: MOD I doff L sock seated EOB, MIN A doff R sock. CGA + RW for ADL t/f and grooming standing sinkside.                  Pertinent Vitals/Pain Pain Assessment: 0-10 Pain Score: 5  Pain Location: R knee Pain Descriptors / Indicators: Sore Pain Intervention(s): Limited activity within patient's tolerance;Monitored during session;Repositioned     Hand Dominance     Extremity/Trunk Assessment Upper Extremity Assessment Upper Extremity Assessment: Overall WFL for tasks assessed   Lower Extremity Assessment Lower Extremity Assessment: Generalized weakness   Cervical / Trunk Assessment Cervical / Trunk Assessment: Normal   Communication Communication Communication: No difficulties   Cognition Arousal/Alertness: Awake/alert Behavior During Therapy:  WFL for tasks assessed/performed Overall Cognitive Status: Within Functional Limits for tasks assessed                                     General Comments       Exercises Exercises: Other exercises Other Exercises Other Exercises: Pt educated re: OT role, DME recs, d/c recs, falls prevention, ECS, polar care mgmt Other Exercises: LBD, grooming, sup<>sit, sit<>stand, sitting/standing balance/tolerance, ~30 ft mobility   Shoulder Instructions       Home Living Family/patient expects to be discharged to:: Private residence Living Arrangements: Alone Available Help at Discharge: Friend(s) Type of Home: House Home Access: Stairs to enter [employer] of Steps: 1 Entrance Stairs-Rails: None Home Layout: One level     Bathroom Shower/Tub: Tub/shower unit         Home Equipment: Environmental consultant - 2 wheels;Walker - 4 wheels;Cane - single point;Bedside commode;Grab bars - tub/shower   Additional Comments: plans to d/c to girlfriends house      Prior Functioning/Environment Level of Independence: Independent        Comments: Pt reports no falls in past 6 months but has been limited with household mobility d/t R knee pain.        OT Problem List: Decreased strength;Decreased range of motion;Decreased activity tolerance;Impaired balance (sitting and/or standing);Decreased safety awareness;Decreased knowledge of use of DME or AE      OT Treatment/Interventions: Self-care/ADL training;Therapeutic exercise;Energy conservation;DME and/or AE instruction;Therapeutic activities;Patient/family education;Balance training    OT Goals(Current goals can be found in the care plan section) Acute Rehab OT Goals Patient Stated Goal: to go home OT Goal Formulation: With patient Time For Goal Achievement: 11/13/20 Potential to Achieve Goals: Good ADL Goals Pt Will Perform Grooming: with modified independence;standing (c LRAD PRN) Pt Will Perform Lower Body Dressing: with modified independence;sit to/from stand (c LRAD PRN) Pt Will Transfer to Toilet: with modified independence;regular height toilet;bedside commode (c LRAD PRN)  OT Frequency: Min 1X/week    AM-PAC OT "6 Clicks" Daily Activity     Outcome Measure Help from another person eating meals?: None Help from another person taking care of personal grooming?: A Little Help from another person toileting, which includes using toliet, bedpan, or urinal?: A Little Help from  another person bathing (including washing, rinsing, drying)?: A Little Help from another person to put on and taking off regular upper body clothing?: None Help from another person to put on and taking off regular lower body clothing?: A Little 6 Click Score: 20   End of Session Equipment Utilized During Treatment: Rolling walker Nurse Communication: Mobility status  Activity Tolerance: Patient tolerated treatment well Patient left: in bed;with call bell/phone within reach;with bed alarm set  OT Visit Diagnosis: Other abnormalities of gait and mobility (R26.89);Muscle weakness (generalized) (M62.81)                Time: 4782-9562 OT Time Calculation (min): 30 min Charges:  OT General Charges $OT Visit: 1 Visit OT Evaluation $OT Eval Low Complexity: 1 Low OT Treatments $Self Care/Home Management : 23-37 mins  Kathie Dike, M.S. OTR/L  10/30/20, 4:30 PM  ascom [phone]

## 2020-10-30 NOTE — Progress Notes (Signed)
Subjective:  Patient reports pain as mild to moderate.    Objective:   VITALS:   Vitals:   10/29/20 2003 10/29/20 2335 10/29/20 2351 10/30/20 0511  BP: 136/68 136/68 (!) 147/75 136/82  Pulse: 83 83 82 86  Resp: _0 Temp: 98.7 F (37.1 C) 98.7 F (37.1 C) 99.4 F (37.4 C) 98.3 F (36.8 C)  TempSrc:  Oral    SpO2: 97%  95% 100%  Weight:  112.8 kg    Height:  _1  (1.727 m)      PHYSICAL EXAM:  Neurologically intact ABD soft Neurovascular intact Sensation intact distally Intact pulses distally Dorsiflexion/Plantar flexion intact Incision: dressing C/D/I No cellulitis present Compartment soft  LABS  Results for orders placed or performed during the hospital encounter of 10/29/20 (from the past 24 hour(s))  Glucose, capillary     Status: Abnormal   Collection Time: 10/29/20 12:14 PM  Result Value Ref Range   Glucose-Capillary 114 (H) 70 - 99 mg/dL  ABO/Rh     Status: None   Collection Time: 10/29/20 12:40 PM  Result Value Ref Range   ABO/RH(D)      O POS Performed at [facility], [address] [place], [place] [address]   Glucose, capillary     Status: Abnormal   Collection Time: 10/29/20  4:10 PM  Result Value Ref Range   Glucose-Capillary 155 (H) 70 - 99 mg/dL  Glucose, capillary     Status: Abnormal   Collection Time: 10/29/20  6:14 PM  Result Value Ref Range   Glucose-Capillary 136 (H) 70 - 99 mg/dL  Hemoglobin A1c     Status: Abnormal   Collection Time: 10/29/20  7:51 PM  Result Value Ref Range   Hgb A1c MFr Bld 7.2 (H) 4.8 - 5.6 %   Mean Plasma Glucose 159.94 mg/dL  Glucose, capillary     Status: Abnormal   Collection Time: 10/29/20  8:08 PM  Result Value Ref Range   Glucose-Capillary 255 (H) 70 - 99 mg/dL   Comment 1 Notify RN   Glucose, capillary     Status: Abnormal   Collection Time: 10/29/20 10:06 PM  Result Value Ref Range   Glucose-Capillary 247 (H) 70 - 99 mg/dL  CBC     Status: Abnormal   Collection Time:  10/30/20  4:17 AM  Result Value Ref Range   WBC 18.1 (H) 4.0 - 10.5 K/uL   RBC 4.20 (L) 4.22 - 5.81 MIL/uL   Hemoglobin 12.7 (L) 13.0 - 17.0 g/dL   HCT 36.7 (L) 39.0 - 52.0 %   MCV 87.4 80.0 - 100.0 fL   MCH 30.2 26.0 - 34.0 pg   MCHC 34.6 30.0 - 36.0 g/dL   RDW 13.9 11.5 - 15.5 %   Platelets 196 150 - 400 K/uL   nRBC 0.0 0.0 - 0.2 %  Basic metabolic panel     Status: Abnormal   Collection Time: 10/30/20  4:17 AM  Result Value Ref Range   Sodium 133 (L) 135 - 145 mmol/L   Potassium 4.6 3.5 - 5.1 mmol/L   Chloride 99 98 - 111 mmol/L   CO2 26 22 - 32 mmol/L   Glucose, Bld 167 (H) 70 - 99 mg/dL   BUN 17 8 - 23 mg/dL   Creatinine, Ser 1.01 0.61 - 1.24 mg/dL   Calcium 9.1 8.9 - 10.3 mg/dL   GFR, Estimated >60 >60 mL/min   Anion gap 8 5 - 15  DG Knee Right Port  Result Date: 10/29/2020 CLINICAL DATA:  Post RIGHT knee arthroplasty. EXAM: PORTABLE RIGHT KNEE - 1-2 VIEW COMPARISON:  None FINDINGS: Gas and swelling in the soft tissues following RIGHT total knee arthroplasty. Skin staples in place over the site. No immediate complication related to femoral or tibial component. Normal appearance of patellar component on the lateral view. IMPRESSION: Uncomplicated immediate postoperative appearance of RIGHT total knee arthroplasty. Electronically Signed   By: Zetta Bills M.D.   On: 10/29/2020 18:13   [place] OR NERVE BLOCK-IMAGE ONLY [facility])  Result Date: 10/29/2020 There is no interpretation for this exam.  This order is for images obtained during a surgical procedure.  Please See "Surgeries" Tab for more information regarding the procedure.    Assessment/Plan: 1 Day Post-Op   Active Problems:   History of total knee arthroplasty, right   Advance diet Up with therapy WBAT RLE Anticipate D/C to home tomorrow after PT goals met  Carlynn Spry , PA-C 10/30/2020, 6:47 AM

## 2020-10-30 NOTE — Progress Notes (Signed)
Physical Therapy Treatment Patient Details Name: Ryan Hinton MRN: [id number] DOB: [birth year] Today's Date: 10/30/2020    History of Present Illness Pt is a 65 y.o. male s/p R TKA 4/6 secondary unilateral primary OA.  PMH includes sleep apnea, htn, h/o MI, CAD, CVA, DM, chronic back pain, gout, h/o seizures as a child, htn, NSTEMI.  Pt also reports R eye impairment limiting vision.    PT Comments    Pt resting in bed upon PT arrival; agreeable to PT session.  SBA with bed mobility; CGA with transfers (using RW); CGA ambulating 150 feet with RW; and CGA navigating steps with B UE support (see below for details).  Pt steady with functional mobility with RW use during sessions activities.  Initial vc's required for LE sequencing for steps navigation.  Tolerated LE seated ex's fairly well.  Pain 5-6/10 R knee beginning and end of session at rest (nurse gave pt pain meds end of session).  Will continue to focus on strengthening, R knee ROM, and progressive functional mobility during hospitalization.   Follow Up Recommendations  Outpatient PT     Equipment Recommendations  Rolling walker with 5" wheels;3in1 (PT) (pt reports having RW and BSC already)    Recommendations for Other Services OT consult     Precautions / Restrictions Precautions Precautions: Fall Restrictions Weight Bearing Restrictions: Yes RLE Weight Bearing: Weight bearing as tolerated    Mobility  Bed Mobility Overal bed mobility: Needs Assistance Bed Mobility: Supine to Sit;Sit to Supine     Supine to sit: Supervision;HOB elevated Sit to supine: Supervision;HOB elevated (B UE assist for R LE into bed)   General bed mobility comments: mild increased time to perform on own; vc's for technique    Transfers Overall transfer level: Needs assistance Equipment used: Rolling walker (2 wheeled) Transfers: Sit to/from Stand Sit to Stand: Min guard         General transfer comment: x1 trial from bed and x1 trial  from mat table; occasional vc's for UE placement  Ambulation/Gait Ambulation/Gait assistance: Min guard Gait Distance (Feet): 150 Feet Assistive device: Rolling walker (2 wheeled)   Gait velocity: decreased   General Gait Details: antalgic; mild decreased stance time R LE; decreased B LE step length; step to gait pattern; initial vc's for overall gait technique and positioning within walker   Stairs Stairs: Yes Stairs assistance: Min guard Stair Management: Two rails;Step to pattern;Forwards;With walker Number of Stairs:  (4 steps; 2 platform steps) General stair comments: vc's for LE sequencing; ascended/descended 4 steps with B railings; ascended/descended 1 platform step (x2 trials) with RW   Wheelchair Mobility    Modified Rankin (Stroke Patients Only)       Balance Overall balance assessment: Needs assistance Sitting-balance support: No upper extremity supported;Feet supported Sitting balance-Leahy Scale: Normal Sitting balance - Comments: steady sitting reaching outside BOS   Standing balance support: No upper extremity supported Standing balance-Leahy Scale: Fair Standing balance comment: steady static standing no UE support                            Cognition Arousal/Alertness: Awake/alert Behavior During Therapy: WFL for tasks assessed/performed (impulsive at times) Overall Cognitive Status: Within Functional Limits for tasks assessed                                        Exercises  Total Joint Exercises Long Arc Quad: AROM;Strengthening;Both;10 reps;Seated Knee Flexion: AROM;Strengthening;Both;10 reps;Seated General Exercises - Lower Extremity Hip Flexion/Marching: AROM;Strengthening;Both;10 reps;Seated    General Comments  Pt's significant other present towards end of session.      Pertinent Vitals/Pain Pain Assessment: 0-10 Pain Score: 5  Pain Location: R knee Pain Descriptors / Indicators: Sore Pain Intervention(s):  Limited activity within patient's tolerance;Monitored during session;Repositioned;Patient requesting pain meds-RN notified;RN gave pain meds during session;Other (comment) (polar care applied and activated)  Vitals (HR and O2 on room air) stable and WFL throughout treatment session.    Home Living Family/patient expects to be discharged to:: Private residence Living Arrangements: Alone Available Help at Discharge: Friend(s) Type of Home: House Home Access: Stairs to enter Entrance Stairs-Rails: None Home Layout: One level Home Equipment: Environmental consultant - 2 wheels;Walker - 4 wheels;Cane - single point;Bedside commode;Grab bars - tub/shower Additional Comments: plans to d/c to girlfriends house    Prior Function Level of Independence: Independent      Comments: Pt reports no falls in past 6 months but has been limited with household mobility d/t R knee pain.   PT Goals (current goals can now be found in the care plan section) Acute Rehab PT Goals Patient Stated Goal: to go home PT Goal Formulation: With patient Time For Goal Achievement: 11/13/20 Potential to Achieve Goals: Good Progress towards PT goals: Progressing toward goals    Frequency    BID      PT Plan Current plan remains appropriate    Co-evaluation              AM-PAC PT "6 Clicks" Mobility   Outcome Measure  Help needed turning from your back to your side while in a flat bed without using bedrails?: None Help needed moving from lying on your back to sitting on the side of a flat bed without using bedrails?: A Little Help needed moving to and from a bed to a chair (including a wheelchair)?: A Little Help needed standing up from a chair using your arms (e.g., wheelchair or bedside chair)?: A Little Help needed to walk in hospital room?: A Little Help needed climbing 3-5 steps with a railing? : A Little 6 Click Score: 19    End of Session Equipment Utilized During Treatment: Gait belt Activity Tolerance:  Patient tolerated treatment well Patient left: in bed;with call bell/phone within reach;with bed alarm set;with family/visitor present;with SCD's reapplied;Other (comment) (R heel floating via towel roll; polar care in place and activated) Nurse Communication: Mobility status;Precautions;Weight bearing status;Patient requests pain meds PT Visit Diagnosis: Other abnormalities of gait and mobility (R26.89);Muscle weakness (generalized) (M62.81);Difficulty in walking, not elsewhere classified (R26.2);Pain Pain - Right/Left: Right Pain - part of body: Knee     Time: 1417-1510 PT Time Calculation (min) (ACUTE ONLY): 53 min  Charges:  $Gait Training: 23-37 mins $Therapeutic Exercise: 8-22 mins $Therapeutic Activity: 8-22 mins                    Hendricks Limes, PT 10/30/20, 3:57 PM

## 2020-10-31 ENCOUNTER — Encounter: Payer: Self-pay | Admitting: Orthopedic Surgery

## 2020-10-31 LAB — CBC
HCT: 32.7 % — ABNORMAL LOW (ref 39.0–52.0)
Hemoglobin: 11.2 g/dL — ABNORMAL LOW (ref 13.0–17.0)
MCH: 29.9 pg (ref 26.0–34.0)
MCHC: 34.3 g/dL (ref 30.0–36.0)
MCV: 87.4 fL (ref 80.0–100.0)
Platelets: 187 10*3/uL (ref 150–400)
RBC: 3.74 MIL/uL — ABNORMAL LOW (ref 4.22–5.81)
RDW: 14 % (ref 11.5–15.5)
WBC: 18.3 10*3/uL — ABNORMAL HIGH (ref 4.0–10.5)
nRBC: 0 % (ref 0.0–0.2)

## 2020-10-31 LAB — GLUCOSE, CAPILLARY
Glucose-Capillary: 170 mg/dL — ABNORMAL HIGH (ref 70–99)
Glucose-Capillary: 140 mg/dL — ABNORMAL HIGH (ref 70–99)

## 2020-10-31 MED ORDER — ASPIRIN 81 MG PO CHEW: 81.0000 mg | CHEWABLE_TABLET | Freq: Two times a day (BID) | ORAL | 0 refills | Status: AC

## 2020-10-31 MED ORDER — DOCUSATE SODIUM 100 MG PO CAPS: 100.0000 mg | ORAL_CAPSULE | Freq: Two times a day (BID) | ORAL | 0 refills | Status: AC

## 2020-10-31 MED ORDER — METHOCARBAMOL 500 MG PO TABS: 500.0000 mg | ORAL_TABLET | Freq: Four times a day (QID) | ORAL | 0 refills | Status: AC | PRN

## 2020-10-31 NOTE — Progress Notes (Signed)
No acute events overnight condition remains fair voided freely. Elevated tem of 101.3 tylenol given. Observation continues. Pain control with oral and iv analgesics. Ambulated 1 to bedside tolerated but need need more PT work for ambulation.

## 2020-10-31 NOTE — Care Management Important Message (Signed)
Important Message  Patient Details  Name: Ryan Hinton MRN: [id number] Date of Birth: [birth year]   Medicare Important Message Given:  N/A - LOS <3 / Initial given by admissions  Initial Medicare IM reviewed with patient by Cyril Mourning, Patient Access Associate on 10/30/2020 at 11:19am.   Johnell Comings 10/31/2020, 8:33 AM

## 2020-10-31 NOTE — Progress Notes (Signed)
Physical Therapy Treatment Patient Details Name: Ryan Hinton MRN: [id number] DOB: [birth year] Today's Date: 10/31/2020    History of Present Illness Pt is a 65 y.o. male s/p R TKA 4/6 secondary unilateral primary OA.  PMH includes sleep apnea, htn, h/o MI, CAD, CVA, DM, chronic back pain, gout, h/o seizures as a child, htn, NSTEMI.  Pt also reports R eye impairment limiting vision.    PT Comments    Pt resting in recliner upon PT arrival; improved alertness noted this afternoon; 5/10 R knee pain.  SBA with transfers using RW, CGA with ambulation 180 feet with RW, and CGA navigating platform step (x2 trials) with RW.  No loss of balance noted during sessions activities.  Tolerated sitting R LE ex's fairly well with assist.  7/10 R knee pain end of session at rest (nurse present and plan to give pt pain medication).  Pt reports no questions or concerns for hospital discharge; plan for discharge home today with OP PT.    Follow Up Recommendations  Outpatient PT;Supervision for mobility/OOB     Equipment Recommendations  Rolling walker with 5" wheels;3in1 (PT) (pt reports having RW and BSC already)    Recommendations for Other Services OT consult     Precautions / Restrictions Precautions Precautions: Fall;Knee Precaution Booklet Issued: Yes (comment) Restrictions Weight Bearing Restrictions: Yes RLE Weight Bearing: Weight bearing as tolerated    Mobility  Bed Mobility         General bed mobility comments: Deferred    Transfers Overall transfer level: Needs assistance Equipment used: Rolling walker (2 wheeled) Transfers: Sit to/from Stand Sit to Stand: Supervision         General transfer comment: mild increased effort to stand at times but able to safely perform on own (x1 trial from recliner)  Ambulation/Gait Ambulation/Gait assistance: Min guard Gait Distance (Feet): 180 Feet Assistive device: Rolling walker (2 wheeled)   Gait velocity: decreased    General Gait Details: antalgic; mild decreased stance time R LE; decreased B LE step length; step to gait pattern; steady with RW   Stairs Stairs: Yes Stairs assistance: Min guard Stair Management: Step to pattern;Forwards;With walker Number of Stairs:  (platform step x2 trials) General stair comments: ascended/descended 1 platform step (x2 trials) with RW; discussed/educated pt on LE sequencing technique and then pt able to perform without further cueing   Wheelchair Mobility    Modified Rankin (Stroke Patients Only)       Balance Overall balance assessment: Needs assistance Sitting-balance support: No upper extremity supported;Feet supported Sitting balance-Leahy Scale: Normal Sitting balance - Comments: steady sitting reaching outside BOS   Standing balance support: No upper extremity supported Standing balance-Leahy Scale: Good Standing balance comment: steady standing adjusting face mask                            Cognition Arousal/Alertness: Awake/alert Behavior During Therapy: WFL for tasks assessed/performed Overall Cognitive Status: Within Functional Limits for tasks assessed                                 General Comments: improved alertness noted this afternoon      Exercises Total Joint Exercises Long Arc Quad: AAROM;Strengthening;Right;10 reps;Seated Knee Flexion: AROM;Strengthening;Right;10 reps;Seated (wash cloth under pt's foot to improve ease of foot sliding on floor)    General Comments General comments (skin integrity, edema, etc.): R knee ace wrap  in place.  Nursing cleared pt for participation in physical therapy.  Pt agreeable to PT session.      Pertinent Vitals/Pain Pain Assessment: 0-10 Pain Score: 7  Pain Location: R knee Pain Descriptors / Indicators: Sore;Tender;Operative site guarding Pain Intervention(s): Limited activity within patient's tolerance;Monitored during session;Premedicated before  session;Repositioned;Other (comment) (polar care applied and activated)  Vitals (HR and O2 on room air) stable and WFL throughout treatment session.    Home Living                      Prior Function            PT Goals (current goals can now be found in the care plan section) Acute Rehab PT Goals Patient Stated Goal: to go home PT Goal Formulation: With patient Time For Goal Achievement: 11/13/20 Potential to Achieve Goals: Good Progress towards PT goals: Progressing toward goals    Frequency    BID      PT Plan Current plan remains appropriate    Co-evaluation              AM-PAC PT "6 Clicks" Mobility   Outcome Measure  Help needed turning from your back to your side while in a flat bed without using bedrails?: None Help needed moving from lying on your back to sitting on the side of a flat bed without using bedrails?: A Little Help needed moving to and from a bed to a chair (including a wheelchair)?: A Little Help needed standing up from a chair using your arms (e.g., wheelchair or bedside chair)?: A Little Help needed to walk in hospital room?: A Little Help needed climbing 3-5 steps with a railing? : A Little 6 Click Score: 19    End of Session Equipment Utilized During Treatment: Gait belt Activity Tolerance: Patient tolerated treatment well Patient left: in chair;with call bell/phone within reach;with chair alarm set;with nursing/sitter in room;with SCD's reapplied;Other (comment) (R heel floating via towel roll; polar care in place and activated) Nurse Communication: Mobility status;Patient requests pain meds;Precautions;Weight bearing status PT Visit Diagnosis: Other abnormalities of gait and mobility (R26.89);Muscle weakness (generalized) (M62.81);Difficulty in walking, not elsewhere classified (R26.2);Pain Pain - Right/Left: Right Pain - part of body: Knee     Time: 2409-7353 PT Time Calculation (min) (ACUTE ONLY): 56 min  Charges:   $Gait Training: 23-37 mins $Therapeutic Exercise: 8-22 mins $Therapeutic Activity: 8-22 mins                    Hendricks Limes, PT 10/31/20, 2:14 PM

## 2020-10-31 NOTE — Discharge Summary (Signed)
Physician Discharge Summary  Patient ID: Ryan Hinton MRN: [id number] DOB/AGE: 1955-12-16 65 y.o.  Admit date: 10/29/2020 Discharge date: 10/31/2020  Admission Diagnoses:  M17.11 Unilateral primary osteoarthritis, right knee <principal problem not specified>  Discharge Diagnoses:  M17.11 Unilateral primary osteoarthritis, right knee Active Problems:   History of total knee arthroplasty, right   Past Medical History:  Diagnosis Date  . Arthritis   . CAD (coronary artery disease)   . Chronic back pain   . CVA (cerebral vascular accident) (HCC)   . Diabetes mellitus without complication (HCC)   . GERD (gastroesophageal reflux disease)   . Gout   . Heart murmur, systolic    grade 2/6 to 3/6 present in the aortic area, left sternal border with conduction to carotids  . History of seizures    as a child  . Hypertension   . NSTEMI (non-ST elevated myocardial infarction) (HCC) 09/2014  . OSA (obstructive sleep apnea)    does not require nocturnal PAP therapy    Surgeries: Procedure(s): TOTAL KNEE ARTHROPLASTY on 10/29/2020   Consultants (if any):   Discharged Condition: Improved  Hospital Course: Ryan Hinton is an 65 y.o. male who was admitted 10/29/2020 with a diagnosis of  M17.11 Unilateral primary osteoarthritis, right knee <principal problem not specified> and went to the operating room on 10/29/2020 and underwent the above named procedures.    He was given perioperative antibiotics:  Anti-infectives (From admission, onward)   Start     Dose/Rate Route Frequency Ordered Stop   10/29/20 1900  ceFAZolin (ANCEF) IVPB 2g/100 mL premix        2 g 200 mL/hr over 30 Minutes Intravenous Every 6 hours 10/29/20 1806 10/30/20 0205   10/29/20 1214  ceFAZolin (ANCEF) 2-4 GM/100ML-% IVPB       Note to Pharmacy: Mikey Bussing   : cabinet override      10/29/20 1214 10/29/20 1418   10/29/20 0600  ceFAZolin (ANCEF) IVPB 2g/100 mL premix        2 g 200 mL/hr over 30 Minutes  Intravenous On call to O.R. 10/29/20 0124 10/29/20 1442    .  He was given sequential compression devices, early ambulation, and aspirin for DVT prophylaxis.  He benefited maximally from the hospital stay and there were no complications.    Recent vital signs:  Vitals:   10/31/20 0447 10/31/20 0731  BP: 139/75 117/67  Pulse: 87 84  Resp: 19 18  Temp: (!) 101.4 F (38.6 C) 99 F (37.2 C)  SpO2: 100% 97%    Recent laboratory studies:  Lab Results  Component Value Date   HGB 11.2 (L) 10/31/2020   HGB 12.7 (L) 10/30/2020   HGB 13.7 10/21/2020   Lab Results  Component Value Date   WBC 18.3 (H) 10/31/2020   PLT 187 10/31/2020   Lab Results  Component Value Date   INR 1.0 10/21/2020   Lab Results  Component Value Date   NA 133 (L) 10/30/2020   K 4.6 10/30/2020   CL 99 10/30/2020   CO2 26 10/30/2020   BUN 17 10/30/2020   CREATININE 1.01 10/30/2020   GLUCOSE 167 (H) 10/30/2020    Discharge Medications:   Allergies as of 10/31/2020   No Known Allergies     Medication List    STOP taking these medications   aspirin EC 81 MG tablet Replaced by: aspirin 81 MG chewable tablet     TAKE these medications   allopurinol 300 MG tablet Commonly known as: ZYLOPRIM  Take 300 mg by mouth daily.   amLODipine 10 MG tablet Commonly known as: NORVASC Take 10 mg by mouth daily.   aspirin 81 MG chewable tablet Chew 1 tablet (81 mg total) by mouth 2 (two) times daily. Replaces: aspirin EC 81 MG tablet   docusate sodium 100 MG capsule Commonly known as: COLACE Take 1 capsule (100 mg total) by mouth 2 (two) times daily.   dorzolamide-timolol 22.3-6.8 MG/ML ophthalmic solution Commonly known as: COSOPT Place 1 drop into both eyes 2 (two) times daily.   gabapentin 300 MG capsule Commonly known as: NEURONTIN Take 300 mg by mouth 2 (two) times daily.   hydrALAZINE 25 MG tablet Commonly known as: APRESOLINE Take 25 mg by mouth 3 (three) times daily.   metFORMIN 500 MG  24 hr tablet Commonly known as: GLUCOPHAGE-XR Take 1,000 mg by mouth 2 (two) times daily.   methocarbamol 500 MG tablet Commonly known as: ROBAXIN Take 1 tablet (500 mg total) by mouth every 6 (six) hours as needed for muscle spasms.   metoprolol succinate 25 MG 24 hr tablet Commonly known as: TOPROL-XL Take 25 mg by mouth daily.   olmesartan 40 MG tablet Commonly known as: BENICAR Take 40 mg by mouth daily.   OVER THE COUNTER MEDICATION Take 2 tablets by mouth daily. Heal-N-Soothe   oxyCODONE-acetaminophen 10-325 MG tablet Commonly known as: PERCOCET Take 1 tablet by mouth every 6 (six) hours as needed for pain.   pantoprazole 40 MG tablet Commonly known as: PROTONIX Take 40 mg by mouth daily as needed (acid reflux).   sildenafil 100 MG tablet Commonly known as: VIAGRA Take 100 mg by mouth as needed for erectile dysfunction.            Durable Medical Equipment  (From admission, onward)         Start     Ordered   10/31/20 0810  For home use only DME 3 n 1  Once        10/31/20 0809   10/31/20 0810  For home use only DME Walker rolling  Once       Question Answer Comment  Walker: With 5 Inch Wheels   Patient needs a walker to treat with the following condition Osteoarthritis of left knee      10/31/20 0809   10/29/20 1807  DME Walker rolling  Once       Question Answer Comment  Walker: With 5 Inch Wheels   Patient needs a walker to treat with the following condition History of total knee arthroplasty, right      10/29/20 1806   10/29/20 1807  DME 3 n 1  Once        10/29/20 1806   10/29/20 1807  DME Bedside commode  Once       Question:  Patient needs a bedside commode to treat with the following condition  Answer:  History of total knee arthroplasty, right   10/29/20 1806          Diagnostic Studies: DG Knee Right Port  Result Date: 10/29/2020 CLINICAL DATA:  Post RIGHT knee arthroplasty. EXAM: PORTABLE RIGHT KNEE - 1-2 VIEW COMPARISON:  None  FINDINGS: Gas and swelling in the soft tissues following RIGHT total knee arthroplasty. Skin staples in place over the site. No immediate complication related to femoral or tibial component. Normal appearance of patellar component on the lateral view. IMPRESSION: Uncomplicated immediate postoperative appearance of RIGHT total knee arthroplasty. Electronically Signed   By: Juliene Pina  Wile M.D.   On: 10/29/2020 18:13   [place] OR NERVE BLOCK-IMAGE ONLY [facility])  Result Date: 10/29/2020 There is no interpretation for this exam.  This order is for images obtained during a surgical procedure.  Please See "Surgeries" Tab for more information regarding the procedure.    Disposition: Discharge disposition: 01-Home or Self Care            Signed: Altamese Cabal ,PA-C 10/31/2020, 8:10 AM

## 2020-10-31 NOTE — Progress Notes (Signed)
  Subjective:  Patient reports pain as mild to moderate.    Objective:   VITALS:   Vitals:   10/30/20 2034 10/30/20 2346 10/31/20 0447 10/31/20 0731  BP: 131/72 135/60 139/75 117/67  Pulse: 88 86 87 84  Resp: _0 Temp: 99.7 F (37.6 C)  (!) 101.4 F (38.6 C) 99 F (37.2 C)  TempSrc: Oral   Oral  SpO2: 96% 93% 100% 97%  Weight:      Height:        PHYSICAL EXAM:  Neurologically intact ABD soft Neurovascular intact Sensation intact distally Intact pulses distally Dorsiflexion/Plantar flexion intact Incision: scant drainage and dressing changed today No cellulitis present Compartment soft  LABS  Results for orders placed or performed during the hospital encounter of 10/29/20 (from the past 24 hour(s))  Glucose, capillary     Status: Abnormal   Collection Time: 10/30/20 11:46 AM  Result Value Ref Range   Glucose-Capillary 189 (H) 70 - 99 mg/dL  Glucose, capillary     Status: Abnormal   Collection Time: 10/30/20  4:35 PM  Result Value Ref Range   Glucose-Capillary 139 (H) 70 - 99 mg/dL  Glucose, capillary     Status: Abnormal   Collection Time: 10/30/20  8:36 PM  Result Value Ref Range   Glucose-Capillary 180 (H) 70 - 99 mg/dL   Comment 1 Notify RN   CBC     Status: Abnormal   Collection Time: 10/31/20  4:37 AM  Result Value Ref Range   WBC 18.3 (H) 4.0 - 10.5 K/uL   RBC 3.74 (L) 4.22 - 5.81 MIL/uL   Hemoglobin 11.2 (L) 13.0 - 17.0 g/dL   HCT 32.7 (L) 39.0 - 52.0 %   MCV 87.4 80.0 - 100.0 fL   MCH 29.9 26.0 - 34.0 pg   MCHC 34.3 30.0 - 36.0 g/dL   RDW 14.0 11.5 - 15.5 %   Platelets 187 150 - 400 K/uL   nRBC 0.0 0.0 - 0.2 %  Glucose, capillary     Status: Abnormal   Collection Time: 10/31/20  7:28 AM  Result Value Ref Range   Glucose-Capillary 170 (H) 70 - 99 mg/dL   Comment 1 Notify RN     DG Knee Right Port  Result Date: 10/29/2020 CLINICAL DATA:  Post RIGHT knee arthroplasty. EXAM: PORTABLE RIGHT KNEE - 1-2 VIEW COMPARISON:  None FINDINGS:  Gas and swelling in the soft tissues following RIGHT total knee arthroplasty. Skin staples in place over the site. No immediate complication related to femoral or tibial component. Normal appearance of patellar component on the lateral view. IMPRESSION: Uncomplicated immediate postoperative appearance of RIGHT total knee arthroplasty. Electronically Signed   By: Zetta Bills M.D.   On: 10/29/2020 18:13   [place] OR NERVE BLOCK-IMAGE ONLY [facility])  Result Date: 10/29/2020 There is no interpretation for this exam.  This order is for images obtained during a surgical procedure.  Please See "Surgeries" Tab for more information regarding the procedure.    Assessment/Plan: 2 Days Post-Op   Active Problems:   History of total knee arthroplasty, right   Advance diet Up with therapy  Discharge home if PT goals met today   Carlynn Spry , PA-C 10/31/2020, 8:05 AM

## 2020-10-31 NOTE — Progress Notes (Signed)
Discharge Note: Reviewed discharge instructions. Pt verbalized understanding. Pt discharged with compression socks, extra honeycomb dressing, polar care, and personal belongings. IV cath intact upon removal. Pt transported to home via family private transport.

## 2020-10-31 NOTE — Progress Notes (Signed)
Occupational Therapy Treatment Patient Details Name: Ryan Hinton MRN: [id number] DOB: 1956/07/14 Today's Date: 10/31/2020    History of present illness Pt is a 65 y.o. male s/p R TKA 4/6 secondary unilateral primary OA.  PMH includes sleep apnea, htn, h/o MI, CAD, CVA, DM, chronic back pain, gout, h/o seizures as a child, htn, NSTEMI.  Pt also reports R eye impairment limiting vision.   OT comments  Mr Lacivita was seen for OT treatment on this date. Upon arrival to room pt asleep in bed, agreeable to OOB for lunch. Pt required CGA + RW urinal use in standing. CGA + RW for ADL t/f. Pt continues to be drowsy but improved attention to task as pt arouses. Pt making good progress toward goals. Pt continues to benefit from skilled OT services to maximize return to PLOF and minimize risk of future falls, injury, caregiver burden, and readmission. Will continue to follow POC. Discharge recommendation remains appropriate.    Follow Up Recommendations  No OT follow up;Supervision - Intermittent    Equipment Recommendations  None recommended by OT    Recommendations for Other Services      Precautions / Restrictions Precautions Precautions: Fall Restrictions Weight Bearing Restrictions: Yes RLE Weight Bearing: Weight bearing as tolerated       Mobility Bed Mobility Overal bed mobility: Needs Assistance Bed Mobility: Supine to Sit     Supine to sit: Min guard      Transfers Overall transfer level: Needs assistance Equipment used: Rolling walker (2 wheeled) Transfers: Sit to/from Stand Sit to Stand: Min guard         General transfer comment: CGA for safety    Balance Overall balance assessment: Needs assistance Sitting-balance support: No upper extremity supported;Feet supported Sitting balance-Leahy Scale: Normal Sitting balance - Comments: steady sitting reaching outside BOS   Standing balance support: Single extremity supported;During functional  activity Standing balance-Leahy Scale: Fair Standing balance comment: CGA                           ADL either performed or assessed with clinical judgement   ADL Overall ADL's : Needs assistance/impaired                                       General ADL Comments: CGA + RW urinal use in standing. CGA + RW for ADL t/f               Cognition Arousal/Alertness: Awake/alert Behavior During Therapy: WFL for tasks assessed/performed Overall Cognitive Status: Within Functional Limits for tasks assessed                                 General Comments: drowsy this date        Exercises Exercises: Other exercises Other Exercises Other Exercises: Pt educated re: OT role, DME recs, d/c recs, falls prevention Other Exercises: toileting, sup>sit, sit<>stand, sitting/standing balance/tolerance   Shoulder Instructions       General Comments R knee ace wrap in place    Pertinent Vitals/ Pain       Pain Assessment: No/denies pain         Frequency  Min 1X/week        Progress Toward Goals  OT Goals(current goals can now be found in the care plan section)  Progress towards OT goals: Progressing toward goals  Acute Rehab OT Goals Patient Stated Goal: to go home OT Goal Formulation: With patient Time For Goal Achievement: 11/13/20 Potential to Achieve Goals: Good ADL Goals Pt Will Perform Grooming: with modified independence;standing Pt Will Perform Lower Body Dressing: with modified independence;sit to/from stand Pt Will Transfer to Toilet: with modified independence;regular height toilet;bedside commode  Plan Discharge plan remains appropriate;Frequency remains appropriate       AM-PAC OT "6 Clicks" Daily Activity     Outcome Measure   Help from another person eating meals?: None Help from another person taking care of personal grooming?: A Little Help from another person toileting, which includes using toliet, bedpan,  or urinal?: A Little Help from another person bathing (including washing, rinsing, drying)?: A Little Help from another person to put on and taking off regular upper body clothing?: None Help from another person to put on and taking off regular lower body clothing?: A Little 6 Click Score: 20    End of Session    OT Visit Diagnosis: Other abnormalities of gait and mobility (R26.89);Muscle weakness (generalized) (M62.81)   Activity Tolerance Patient tolerated treatment well   Patient Left in chair;with call bell/phone within reach;with chair alarm set   Nurse Communication          Time: 3710-6269 OT Time Calculation (min): 13 min  Charges: OT General Charges $OT Visit: 1 Visit OT Treatments $Self Care/Home Management : 8-22 mins  Kathie Dike, M.S. OTR/L  10/31/20, 1:44 PM  ascom [phone]

## 2020-10-31 NOTE — Discharge Instructions (Signed)
Continue weight bear as tolerated on the right lower extremity.    Elevate the right lower extremity whenever possible and continue the polar care while elevating the extremity. Patient may shower. No bath or submerging the wound.    Take aspirin as directed for blood clot prevention.  Continue to work on knee range of motion exercises at home as instructed by physical therapy. Continue to use a walker for assistance with ambulation until cleared by physical therapy.  Call [phone] with any questions, such as fever > 101.5 degrees, drainage from the wound or shortness of breath.  

## 2020-10-31 NOTE — Progress Notes (Signed)
Physical Therapy Treatment Patient Details Name: Ryan Hinton MRN: [id number] DOB: 02-Dec-1955 Today's Date: 10/31/2020    History of Present Illness Pt is a 65 y.o. male s/p R TKA 4/6 secondary unilateral primary OA.  PMH includes sleep apnea, htn, h/o MI, CAD, CVA, DM, chronic back pain, gout, h/o seizures as a child, htn, NSTEMI.  Pt also reports R eye impairment limiting vision.    PT Comments    Pt awake (resting in bed) upon PT arrival; agreeable to PT session.  Pt received pain medication prior to PT session (7/10 R knee pain beginning of session; 5/10 at rest end of session).  Tolerated LE ex's in bed fairly well.  R knee flexion AROM to 90 degrees sitting edge of bed.  Pt ambulated 50 feet x2 with RW during session but had 2 loss of balance (pt appearing drowsy during session) requiring mod assist to regain balance both times while walking.  Able to ascend/descend platform step x2 trials with RW CGA x2 (2nd assist for safety d/t pt's drowsiness).  Pt assisted back to bed to take a nap end of session (d/t pt's drowsiness) and pt lightly snoring in bed prior to therapist even leaving pt's room.  Plan for discharge today but pt needs another PT session prior to discharge today to make sure pt is safe with functional mobility for home discharge (pt's nurse and charge nurse notified).   Follow Up Recommendations  Outpatient PT;Supervision for mobility/OOB     Equipment Recommendations  Rolling walker with 5" wheels;3in1 (PT) (pt reports having RW and BSC already)    Recommendations for Other Services OT consult     Precautions / Restrictions Precautions Precautions: Fall Restrictions Weight Bearing Restrictions: Yes RLE Weight Bearing: Weight bearing as tolerated    Mobility  Bed Mobility Overal bed mobility: Needs Assistance Bed Mobility: Supine to Sit;Sit to Supine     Supine to sit: Supervision Sit to supine: Supervision   General bed mobility comments: bed flat;  using L LE to assist with R LE; x2 trials each supine to/from sitting    Transfers Overall transfer level: Needs assistance Equipment used: Rolling walker (2 wheeled) Transfers: Sit to/from Stand Sit to Stand: Min guard         General transfer comment: x1 trial from bed and x1 trial from mat table; occasional vc's for UE placement  Ambulation/Gait Ambulation/Gait assistance: Min guard (mod assist 2x's d/t loss of balance) Gait Distance (Feet):  (50 feet x2) Assistive device: Rolling walker (2 wheeled)   Gait velocity: decreased   General Gait Details: antalgic; mild decreased stance time R LE; decreased B LE step length; step to gait pattern; intermittent vc's for overall gait technique; 2 loss of balance during ambulation requiring mod assist to regain balance   Stairs Stairs: Yes Stairs assistance: Min guard;+2 safety/equipment Stair Management: Step to pattern;Forwards;With walker Number of Stairs:  (platform step x2 trials) General stair comments: vc's for LE sequencing and overall technique required; ascended/descended 1 platform step (x2 trials) with RW; 2 assist required for safety d/t pt's drowsiness   Wheelchair Mobility    Modified Rankin (Stroke Patients Only)       Balance Overall balance assessment: Needs assistance Sitting-balance support: No upper extremity supported;Feet supported Sitting balance-Leahy Scale: Normal Sitting balance - Comments: steady sitting reaching outside BOS   Standing balance support: Bilateral upper extremity supported;During functional activity Standing balance-Leahy Scale: Poor Standing balance comment: pt steady except for 2 loss of balance ambulating with RW  requiring mod assist to regain balance                            Cognition Arousal/Alertness:  (Drowsy) Behavior During Therapy: WFL for tasks assessed/performed Overall Cognitive Status: Impaired/Different from baseline                                  General Comments: pt appearing more drowsy and demonstrating more difficulty with activities d/t this      Exercises Total Joint Exercises Ankle Circles/Pumps: AROM;Strengthening;Both;10 reps;Supine Quad Sets: AROM;Strengthening;Both;10 reps;Supine Short Arc Quad: AAROM;Strengthening;Right;10 reps;Supine Heel Slides: AAROM;Strengthening;Right;10 reps;Supine Hip ABduction/ADduction: AAROM;Strengthening;Right;10 reps;Supine Straight Leg Raises: AAROM;Strengthening;Right;10 reps;Supine Goniometric ROM: R knee 5 degrees short of neutral extension semi-supine in bed; R knee flexion 90 degrees AROM in sitting    General Comments General comments (skin integrity, edema, etc.): R knee ace wrap in place.  Nursing cleared pt for participation in physical therapy.  Pt agreeable to PT session.      Pertinent Vitals/Pain Pain Assessment: 0-10 Pain Score: 5  Pain Location: R knee Pain Descriptors / Indicators: Sore Pain Intervention(s): Limited activity within patient's tolerance;Monitored during session;Premedicated before session;Repositioned;Other (comment) (polar care applied and activated)  Vitals (HR and O2 on room air) stable and WFL throughout treatment session.    Home Living                      Prior Function            PT Goals (current goals can now be found in the care plan section) Acute Rehab PT Goals Patient Stated Goal: to go home PT Goal Formulation: With patient Time For Goal Achievement: 11/13/20 Potential to Achieve Goals: Good Progress towards PT goals: Progressing toward goals (limited d/t pt's drowsiness)    Frequency    BID      PT Plan Current plan remains appropriate    Co-evaluation              AM-PAC PT "6 Clicks" Mobility   Outcome Measure  Help needed turning from your back to your side while in a flat bed without using bedrails?: None Help needed moving from lying on your back to sitting on the side of a flat bed  without using bedrails?: A Little Help needed moving to and from a bed to a chair (including a wheelchair)?: A Little Help needed standing up from a chair using your arms (e.g., wheelchair or bedside chair)?: A Little Help needed to walk in hospital room?: A Little Help needed climbing 3-5 steps with a railing? : A Little 6 Click Score: 19    End of Session Equipment Utilized During Treatment: Gait belt Activity Tolerance: Other (comment) (Limited d/t pt's drowsiness) Patient left: in bed;with call bell/phone within reach;with bed alarm set;with SCD's reapplied;Other (comment) (R heel floating via towel roll) Nurse Communication: Mobility status;Precautions;Weight bearing status;Other (comment) (pt's drowsiness and 2 loss of balance with ambulation) PT Visit Diagnosis: Other abnormalities of gait and mobility (R26.89);Muscle weakness (generalized) (M62.81);Difficulty in walking, not elsewhere classified (R26.2);Pain Pain - Right/Left: Right Pain - part of body: Knee     Time: 9675-9163 PT Time Calculation (min) (ACUTE ONLY): 56 min  Charges:  $Gait Training: 23-37 mins $Therapeutic Exercise: 8-22 mins $Therapeutic Activity: 8-22 mins  Hendricks Limes, PT 10/31/20, 10:32 AM

## 2021-01-27 ENCOUNTER — Inpatient Hospital Stay
Admit: 2021-01-27 | Discharge: 2021-01-27 | Disposition: A | Discharge status: Home / Self Care | Payer: MEDICARE | Attending: Emergency Medicine

## 2021-01-27 DIAGNOSIS — U071 COVID-19: Secondary | ICD-10-CM

## 2021-01-27 LAB — URINALYSIS W/ REFLEX CULTURE
BACTERIA, URINE: NEGATIVE /hpf
Bacteria: NEGATIVE /hpf
Bilirubin, Urine: NEGATIVE
Bilirubin: NEGATIVE
Glucose, Ur: NEGATIVE mg/dL
Glucose: NEGATIVE mg/dL
Leukocyte Esterase, Urine: NEGATIVE
Leukocyte Esterase: NEGATIVE
Nitrite, Urine: NEGATIVE
Nitrites: NEGATIVE
Protein, UA: 30 mg/dL — AB
Protein: 30 mg/dL — AB
Specific Gravity, UA: 1.02 (ref 1.003–1.030)
Specific gravity: 1.02 (ref 1.003–1.030)
Urobilinogen, UA, POCT: 1 EU/dL (ref 0.2–1.0)
Urobilinogen: 1 EU/dL (ref 0.2–1.0)
pH (UA): 7.5 (ref 5.0–8.0)
pH, UA: 7.5 (ref 5.0–8.0)

## 2021-01-27 LAB — COVID-19 WITH INFLUENZA A/B
Influenza A By PCR: NOT DETECTED
Influenza A by PCR: NOT DETECTED
Influenza B By PCR: NOT DETECTED
Influenza B by PCR: NOT DETECTED
SARS-CoV-2 by PCR: DETECTED — AB
SARS-CoV-2: DETECTED — AB

## 2021-01-27 MED ORDER — METRONIDAZOLE 250 MG TAB
250 mg | ORAL | Status: AC
Start: 2021-01-27 — End: 2021-01-27
  Administered 2021-01-27: 21:00:00 via ORAL

## 2021-01-27 MED ORDER — IBUPROFEN 400 MG TAB
400 mg | ORAL_TABLET | Freq: Four times a day (QID) | ORAL | 0 refills | Status: AC | PRN
Start: 2021-01-27 — End: 2021-10-14

## 2021-01-27 MED ORDER — CORICIDIN HBP COLD AND FLU 2 MG-325 MG TABLET
2-325 mg | ORAL_TABLET | ORAL | 0 refills | Status: AC | PRN
Start: 2021-01-27 — End: 2021-02-01

## 2021-01-27 MED ORDER — CEFTRIAXONE 500 MG SOLUTION FOR INJECTION
500 mg | INTRAMUSCULAR | Status: AC
Start: 2021-01-27 — End: 2021-01-27
  Administered 2021-01-27: 21:00:00 via INTRAMUSCULAR

## 2021-01-27 MED ORDER — DOXYCYCLINE HYCLATE 100 MG TAB
100 mg | ORAL_TABLET | Freq: Two times a day (BID) | ORAL | 0 refills | Status: AC
Start: 2021-01-27 — End: 2021-02-03

## 2021-01-27 MED ORDER — PAXLOVID 300 MG (150 MG X 2)-100 MG TABLETS IN A DOSE PACK (EUA)
3001502-100 mg (150 mg x 2)-100 mg | ORAL | 0 refills | Status: AC
Start: 2021-01-27 — End: 2021-10-13

## 2021-01-27 MED ORDER — DOXYCYCLINE 100 MG CAP
100 mg | ORAL | Status: AC
Start: 2021-01-27 — End: 2021-01-27
  Administered 2021-01-27: 21:00:00 via ORAL

## 2021-01-27 MED FILL — CEFTRIAXONE 500 MG SOLUTION FOR INJECTION: 500 mg | INTRAMUSCULAR | Qty: 500

## 2021-01-27 MED FILL — METRONIDAZOLE 250 MG TAB: 250 mg | ORAL | Qty: 8

## 2021-01-27 MED FILL — DOXYCYCLINE 100 MG CAP: 100 mg | ORAL | Qty: 1

## 2021-01-27 NOTE — ED Notes (Signed)
Pt reports he was recently exposed to someone with COVID and veneral disease--he would like to be checked for both--symptoms include cough, nasal congestion, sore throat x 1 wk. Also endorses dysuria.

## 2021-01-27 NOTE — ED Provider Notes (Signed)
Patient is a 65 year old male presenting to the emergency department for evaluation.  He states that a sexual partner that he was active with 2 weeks ago called him yesterday to say that she had recently been diagnosed with COVID-19 as well as an STI.  She told him he should come to get checked out and he believes that he may have had some urinary burning and a cough earlier this morning.  No significant chest pain shortness of breath leg swelling or other symptoms (he notes some chronic symptoms but nothing new or changed).           Past Medical History:   Diagnosis Date   ??? Chronic GERD    ??? Diabetes (HCC)    ??? Erectile dysfunction    ??? Hypercholesteremia    ??? Hypertension        No past surgical history on file.      No family history on file.    Social History     Socioeconomic History   ??? Marital status: SINGLE     Spouse name: Not on file   ??? Number of children: Not on file   ??? Years of education: Not on file   ??? Highest education level: Not on file   Occupational History   ??? Not on file   Tobacco Use   ??? Smoking status: Never Smoker   ??? Smokeless tobacco: Never Used   Substance and Sexual Activity   ??? Alcohol use: Yes     Comment: occasionally   ??? Drug use: Not on file   ??? Sexual activity: Not on file   Other Topics Concern   ??? Not on file   Social History Narrative   ??? Not on file     Social Determinants of Health     Financial Resource Strain:    ??? Difficulty of Paying Living Expenses: Not on file   Food Insecurity:    ??? Worried About Running Out of Food in the Last Year: Not on file   ??? Ran Out of Food in the Last Year: Not on file   Transportation Needs:    ??? Lack of Transportation (Medical): Not on file   ??? Lack of Transportation (Non-Medical): Not on file   Physical Activity:    ??? Days of Exercise per Week: Not on file   ??? Minutes of Exercise per Session: Not on file   Stress:    ??? Feeling of Stress : Not on file   Social Connections:    ??? Frequency of Communication with Friends and Family: Not on file    ??? Frequency of Social Gatherings with Friends and Family: Not on file   ??? Attends Religious Services: Not on file   ??? Active Member of Clubs or Organizations: Not on file   ??? Attends Banker Meetings: Not on file   ??? Marital Status: Not on file   Intimate Partner Violence:    ??? Fear of Current or Ex-Partner: Not on file   ??? Emotionally Abused: Not on file   ??? Physically Abused: Not on file   ??? Sexually Abused: Not on file   Housing Stability:    ??? Unable to Pay for Housing in the Last Year: Not on file   ??? Number of Places Lived in the Last Year: Not on file   ??? Unstable Housing in the Last Year: Not on file         ALLERGIES: Patient has no  known allergies.    Review of Systems   Constitutional: Negative for chills, diaphoresis and fatigue.   HENT: Negative for ear pain, nosebleeds and sore throat.    Eyes: Negative.  Negative for pain, discharge and redness.   Respiratory: Positive for cough. Negative for chest tightness, shortness of breath and wheezing.    Cardiovascular: Negative.  Negative for chest pain, palpitations and leg swelling.   Gastrointestinal: Negative.  Negative for abdominal pain, constipation, diarrhea, nausea and vomiting.   Endocrine: Negative.  Negative for cold intolerance.   Genitourinary: Positive for dysuria and frequency. Negative for decreased urine volume, difficulty urinating, hematuria, scrotal swelling, testicular pain and urgency.   Musculoskeletal: Negative.  Negative for arthralgias, joint swelling and neck pain.   Skin: Negative.  Negative for color change, pallor, rash and wound.   Allergic/Immunologic: Negative.    Neurological: Negative.  Negative for dizziness, syncope, weakness, light-headedness and headaches.   Hematological: Negative.  Does not bruise/bleed easily.   Psychiatric/Behavioral: Negative.  Negative for behavioral problems, confusion and suicidal ideas.   All other systems reviewed and are negative.      Vitals:    01/27/21 1622   BP: (!) 172/103    Pulse: (!) 107   Resp: 18   Temp: 99.9 ??F (37.7 ??C)   SpO2: 96%   Weight: 108.9 kg (240 lb)   Height: 5\' 6"  (1.676 m)            Physical Exam  Vitals and nursing note reviewed.   Constitutional:       General: He is not in acute distress.     Appearance: Normal appearance. He is normal weight. He is not ill-appearing.   HENT:      Head: Normocephalic and atraumatic.      Right Ear: External ear normal.      Left Ear: External ear normal.      Nose: Nose normal. No congestion or rhinorrhea.      Mouth/Throat:      Mouth: Mucous membranes are moist.   Eyes:      Conjunctiva/sclera: Conjunctivae normal.      Pupils: Pupils are equal, round, and reactive to light.   Cardiovascular:      Rate and Rhythm: Normal rate and regular rhythm.      Pulses: Normal pulses.      Heart sounds: Normal heart sounds. No murmur heard.      Pulmonary:      Effort: No respiratory distress.      Breath sounds: No wheezing.   Abdominal:      General: Abdomen is flat. Bowel sounds are normal. There is no distension.      Palpations: Abdomen is soft.      Tenderness: There is no abdominal tenderness. There is no guarding.   Musculoskeletal:         General: No swelling, tenderness, deformity or signs of injury. Normal range of motion.      Cervical back: Normal range of motion and neck supple.      Right lower leg: No edema.      Left lower leg: No edema.   Skin:     General: Skin is warm and dry.      Capillary Refill: Capillary refill takes less than 2 seconds.      Findings: No bruising, lesion or rash.   Neurological:      General: No focal deficit present.      Mental Status: He is alert and  oriented to person, place, and time. Mental status is at baseline.      Cranial Nerves: No cranial nerve deficit.      Sensory: No sensory deficit.      Motor: No weakness.   Psychiatric:         Mood and Affect: Mood normal.         Behavior: Behavior normal.         Thought Content: Thought content normal.         Judgment: Judgment normal.           MDM  Number of Diagnoses or Management Options  Diagnosis management comments: 65 year old male presenting to the emergency department for evaluation of possible exposure to STD as well as a mild cough that was earlier today (none now) slightly tachycardic on arrival however on repeat exam his heart rate is in the lower 90s.  Discussed with patient he believes he/she may have told him she had trichomonas but is unclear.  Will cover for most common STDs.  Will send COVID and influenza swabs.    COVID-19 is positive.  He states he has been noncompliant with his medications for some time and is not currently taking any medications Will prescribe antiviral for patient.  He does state that he has had COVID-19 vaccines without boosters.       Amount and/or Complexity of Data Reviewed  Clinical lab tests: reviewed           Procedures

## 2021-01-29 NOTE — Progress Notes (Signed)
Patient contacted regarding COVID-19 diagnosis. Discussed COVID-19 related testing which was available at this time. Test results were positive. Patient informed of results, if available? yes.     LPN Care Coordinator contacted the patient by telephone to perform post discharge assessment. Call within 2 business days of discharge: Yes Verified name and DOB with patient as identifiers. Provided introduction to self, and explanation of the CTN/ACM role, and reason for call due to risk factors for infection and/or exposure to COVID-19.     Symptoms reviewed with patient who verbalized the following symptoms: no worsening symptoms      Due to no new or worsening symptoms encounter was not routed to provider for escalation. Discussed follow-up appointments. If no appointment was previously scheduled, appointment scheduling offered:  no.  [facility] follow up appointment(s): No future appointments.  Non-[facility] follow up appointment(s): Follow up with PCP.     Interventions to address risk factors: Obtained and reviewed discharge summary and/or continuity of care documents     Advance Care Planning:   Does patient have an Advance Directive: decision makers updated.     Educated patient about risk for severe COVID-19 due to risk factors according to CDC guidelines. LPN CC reviewed discharge instructions, medical action plan and red flag symptoms with the patient who verbalized understanding. Discussed COVID vaccination status: yes. Education provided on COVID-19 vaccination as appropriate. Discussed exposure protocols and quarantine with CDC Guidelines. Patient was given an opportunity to verbalize any questions and concerns and agrees to contact LPN CC or health care provider for questions related to their healthcare.    Reviewed and educated patient on any new and changed medications related to discharge diagnosis     Was patient discharged with a pulse oximeter? no    LPN CC provided contact information. No further follow-up  call identified based on severity of symptoms and risk factors.

## 2021-01-29 NOTE — ACP (Advance Care Planning) (Signed)
 Advance Care Planning   Healthcare Decision Maker:   Adrian Saunders [phone]    Click here to complete HealthCare Decision Makers including selection of the Healthcare Decision Maker Relationship (ie Primary)

## 2021-01-30 LAB — CHLAMYDIA / GC-AMPLIFIED
CHLAMYDIA TRACHOMATIS, NAA, 188078: NEGATIVE
Chlamydia trachomatis, NAA: NEGATIVE
NEISSERIA GONORRHOEAE, NAA, 188086: NEGATIVE
Neisseria gonorrhoeae, NAA: NEGATIVE

## 2021-09-08 IMAGING — MR MR LUMBAR SPINE W/O CM
4 of 5 series · 30 of 48 positions shown · non-contrast
Comparison: Report from lumbar spine radiographs 08/19/2020 (images
unavailable).

CLINICAL DATA: Chronic right-sided low back pain with right-sided
sciatica. Additional history provided: Patient reports swelling,
pain, numbness and weakness in the right leg/foot, symptoms for 1
year.

EXAM:
MRI LUMBAR SPINE WITHOUT CONTRAST
TECHNIQUE: Multiplanar, multisequence MR imaging of the lumbar spine was
performed. No intravenous contrast was administered.

[Series 5: T2 · sagittal · 4.0mm · 0.81mm/px · 7 of 17 slices shown (1 of 2)]
[im 1/17]
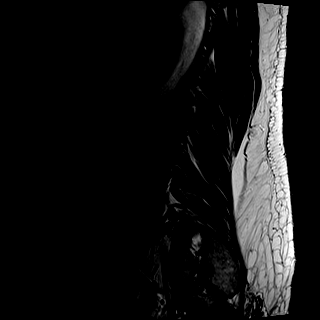
[im 3/17]
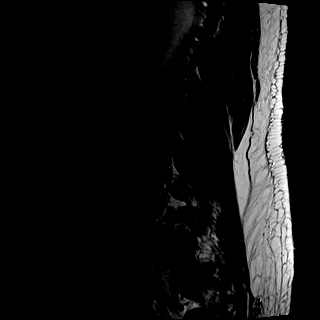
[im 6/17]
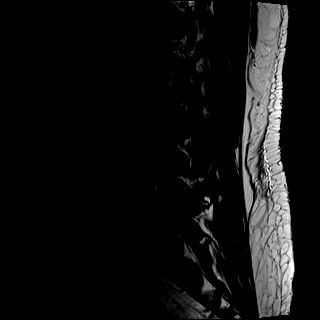
[im 9/17]
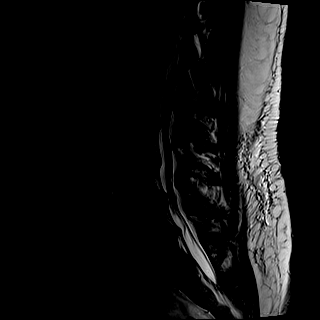
[im 11/17]
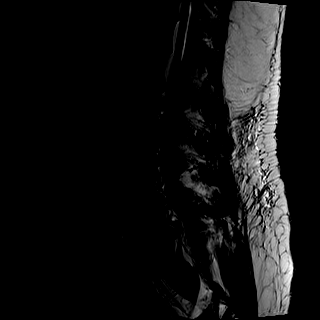
[im 14/17]
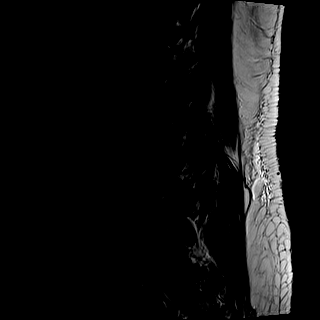
[im 17/17]
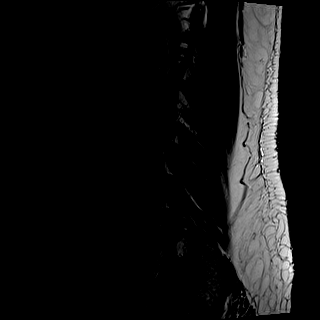

[Series 6: T1 · sagittal · 4.0mm · 0.81mm/px · 7 of 17 slices shown (1 of 2)]
[im 1/17]
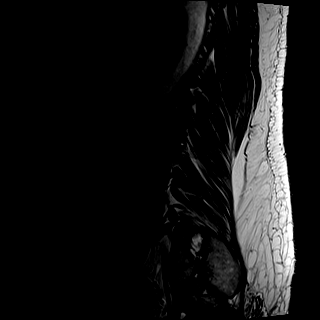
[im 3/17]
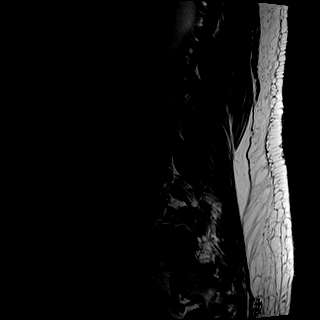
[im 6/17]
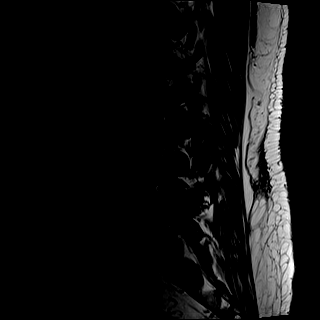
[im 9/17]
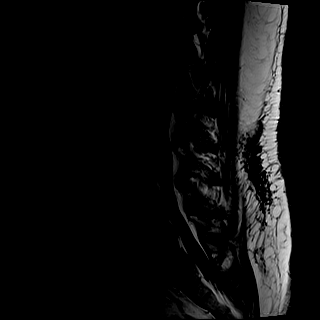
[im 11/17]
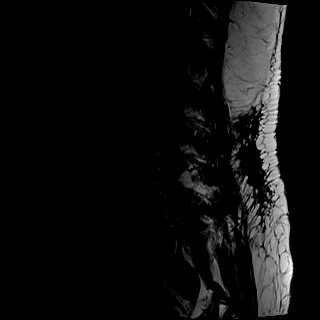
[im 14/17]
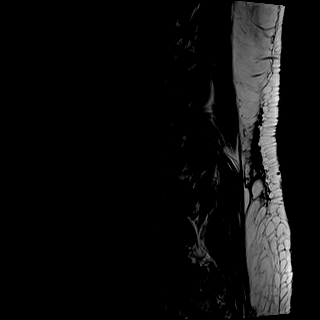
[im 17/17]
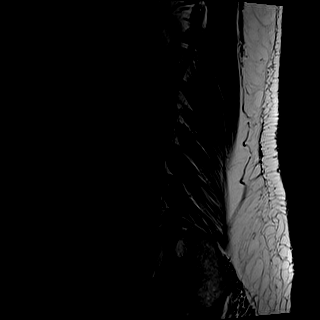

[Series 8: T2 · axial · 4.0mm · 0.78mm/px · z∈[-120,+103]mm · 8 of 38 slices shown (2 of 2)]
[im 1/38]
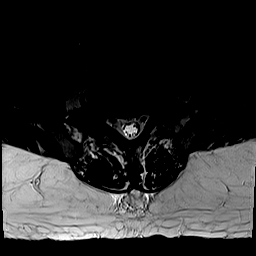
[im 6/38]
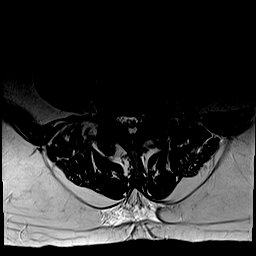
[im 12/38]
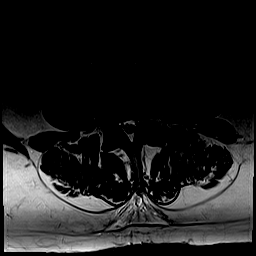
[im 18/38]
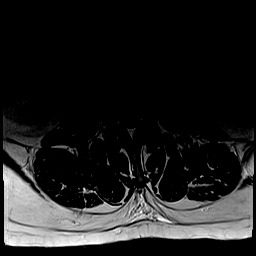
[im 20/38]
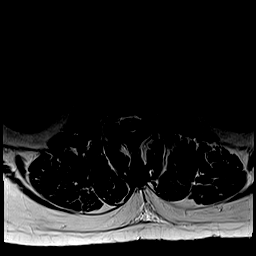
[im 26/38]
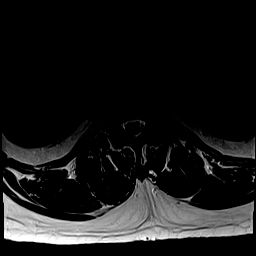
[im 32/38]
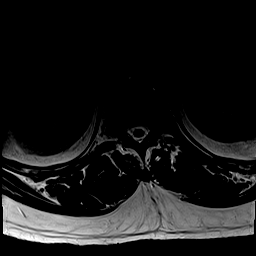
[im 38/38]
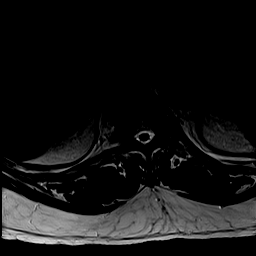

[Series 9: T1 · axial · 4.0mm · 0.39mm/px · z∈[-120,+103]mm · 8 of 38 slices shown (2 of 2)]
[im 1/38]
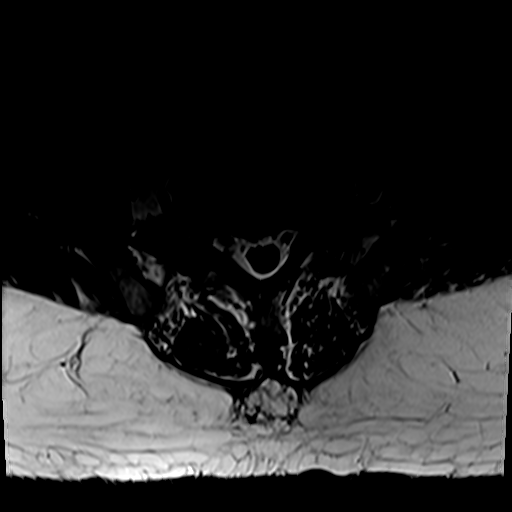
[im 6/38]
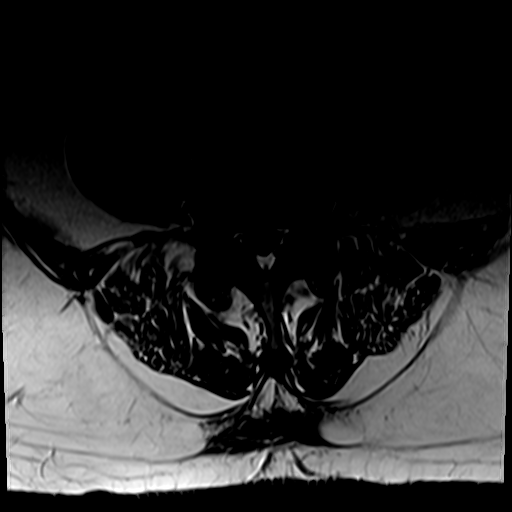
[im 12/38]
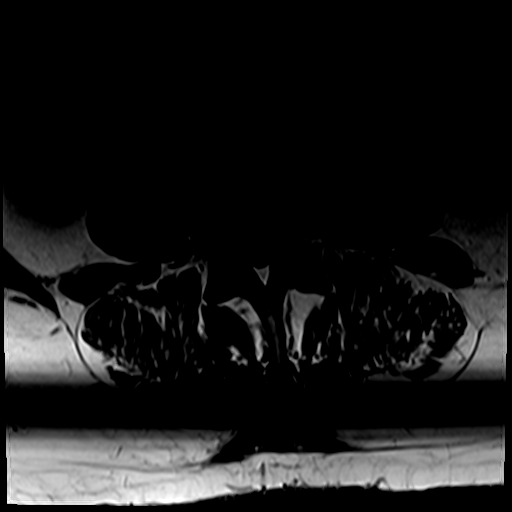
[im 18/38]
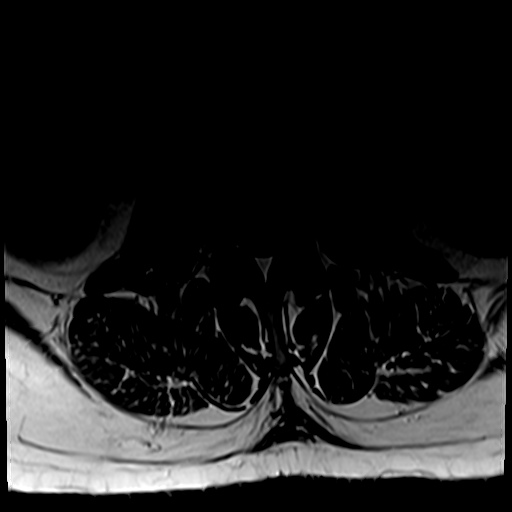
[im 20/38]
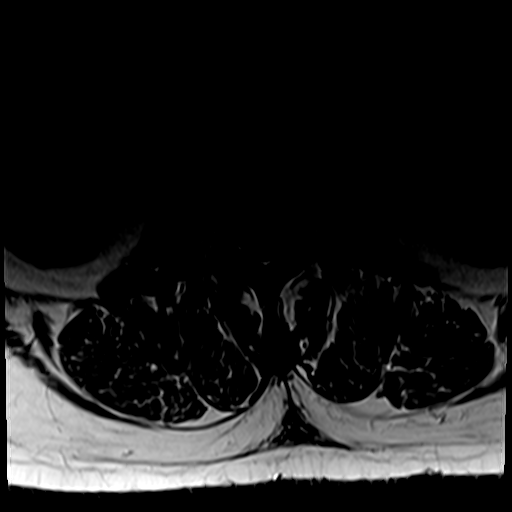
[im 26/38]
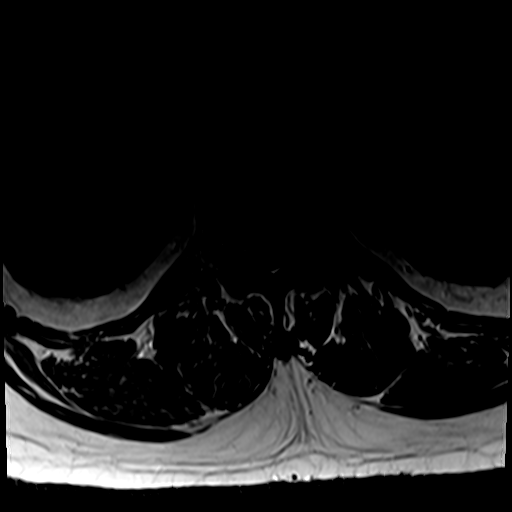
[im 32/38]
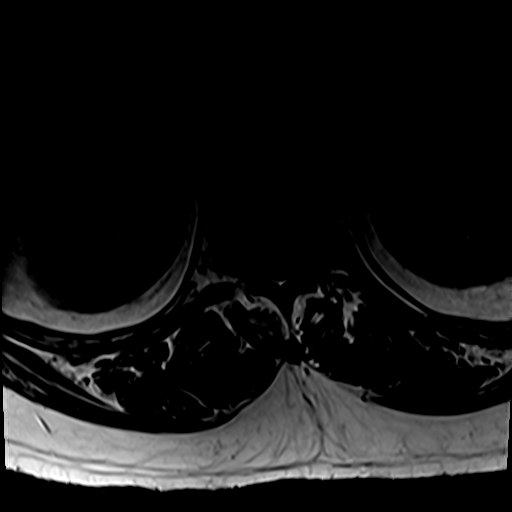
[im 38/38]
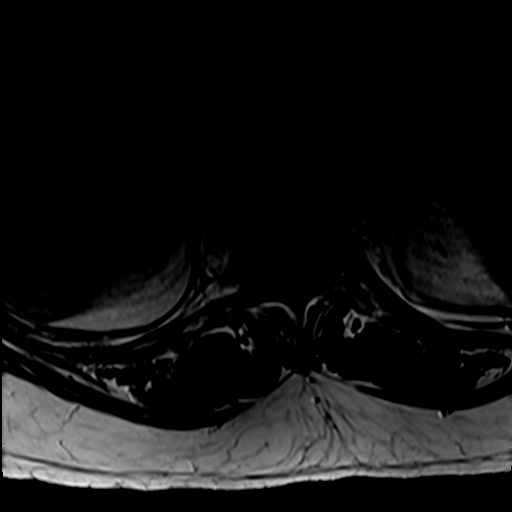

[30 of 48 positions shown; findings below may reference images not displayed]

FINDINGS: Segmentation: The lowest well-formed intervertebral disc space is
designated L5-S1. A rudimentary interspace is present at S1-S2.

Alignment: Lumbar dextrocurvature. Trace T12-L1, L1-L2 grade 1
retrolisthesis. Trace L4-L5 grade 1 anterolisthesis.

Vertebrae: Vertebral body height is maintained. Multilevel trace
degenerative endplate edema, greatest at L5-S1. No focal suspicious
marrow lesion.

Conus medullaris and cauda equina: Conus extends to the L2 inferior
endplate level. No signal abnormality identified within the
visualized distal spinal cord.

Paraspinal and other soft tissues: Right renal cyst. Atrophy of the
lumbar paraspinal musculature. Nonspecific edema signal within the
dorsal subcutaneous fat overlying the lumbar spine.

Disc levels:

Moderate/severe L5-S1 disc degeneration. Mild-to-moderate disc
degeneration at the remaining levels.

T11-T12: Disc bulge. Superimposed right subarticular/foraminal disc
protrusion and left foraminal disc protrusion. Facet
arthrosis/ligamentum flavum hypertrophy. Mild spinal canal
narrowing. The disc bulge may contact the ventral spinal cord, but
there is no significant spinal cord mass effect. Bilateral neural
foraminal narrowing (moderate right, mild/moderate left).

T12-L1: Disc bulge asymmetric to the right with associated endplate
spurring. Mild facet arthrosis. No significant spinal canal stenosis
or neural foraminal narrowing.

L1-L2: Disc bulge. Moderate facet arthrosis with ligamentum flavum
hypertrophy. Mild bilateral subarticular and central canal narrowing
without spinal cord mass effect. Mild/moderate right neural
foraminal narrowing.

L2-L3: Disc bulge with left-sided disc osteophyte ridge. Mild facet
arthrosis/ligamentum flavum hypertrophy. Prominence of the dorsal
epidural fat. Moderate to moderately advanced central canal stenosis
with mild bilateral subarticular narrowing. Bilateral neural
foraminal narrowing (mild right, moderate left).

L3-L4: Disc bulge with endplate spurring. Mild facet
arthrosis/ligamentum flavum hypertrophy. Minimal relative bilateral
subarticular and central canal narrowing. Mild/moderate bilateral
neural foraminal narrowing.

L4-L5: Trace grade 1 anterolisthesis. Disc uncovering with disc
bulge. Superimposed small right center disc protrusion with minimal
cranial migration (series 5, image 9). Advanced facet arthrosis with
ligamentum flavum hypertrophy. Mild/moderate right and mild left
subarticular narrowing without definite nerve root impingement.
Moderate central canal stenosis. Moderate bilateral neural foraminal
narrowing.

L5-S1: Disc bulge with circumferential osteophyte ridge. Moderate
facet arthrosis with ligamentum flavum hypertrophy. Disc osteophyte
ridge contributes to mild/moderate right subarticular narrowing with
posterior displacement of the descending right S1 nerve root (series
8, image 36). Central canal patent. Bilateral neural foraminal
narrowing (moderate/severe right, moderate left).
IMPRESSION: Lumbar spondylosis as outlined with findings most notably as
follows.

At L5-S1, there is moderate/advanced disc degeneration. Disc
osteophyte ridge contributes to mild/moderate right subarticular
narrowing with slight posterior displacement of the descending right
S1 nerve root. Multifactorial bilateral neural foraminal narrowing
(moderate/severe right, moderate left).

At L4-L5, there is advanced facet arthrosis with grade 1
anterolisthesis. Multifactorial mild/moderate right and mild left
subarticular narrowing without definite nerve root impingement.
Moderate central canal stenosis. Moderate bilateral neural foraminal
narrowing.

At L2-L3, spondylosis and dorsal epidural lipomatosis contribute to
moderate to moderately advanced central canal stenosis with
bilateral subarticular narrowing. Multifactorial bilateral neural
foraminal narrowing (mild right, moderate left).

No more than mild spinal canal stenosis at the remaining levels.
Additional sites of neural foraminal narrowing as described,
greatest on the right at T11-T12 (moderate at this site).

Lumbar dextrocurvature.

## 2021-10-13 ENCOUNTER — Inpatient Hospital Stay
Admit: 2021-10-13 | Discharge: 2021-10-13 | Disposition: A | Discharge status: Home / Self Care | Payer: MEDICARE | Attending: Emergency Medicine

## 2021-10-13 ENCOUNTER — Emergency Department: Admit: 2021-10-13 | Payer: MEDICARE | Primary: Student in an Organized Health Care Education/Training Program

## 2021-10-13 DIAGNOSIS — R1110 Vomiting, unspecified: Secondary | ICD-10-CM

## 2021-10-13 DIAGNOSIS — R112 Nausea with vomiting, unspecified: Secondary | ICD-10-CM

## 2021-10-13 LAB — COMPREHENSIVE METABOLIC PANEL
ALT: 24 U/L (ref 12–78)
AST: 15 U/L (ref 15–37)
Albumin/Globulin Ratio: 0.9 — ABNORMAL LOW (ref 1.1–2.2)
Albumin: 3.4 g/dL — ABNORMAL LOW (ref 3.5–5.0)
Alkaline Phosphatase: 47 U/L (ref 45–117)
Anion Gap: 7 mmol/L (ref 5–15)
BUN: 10 mg/dL (ref 6–20)
Bun/Cre Ratio: 11 — ABNORMAL LOW (ref 12–20)
CO2: 31 mmol/L (ref 21–32)
Calcium: 9.1 mg/dL (ref 8.5–10.1)
Chloride: 101 mmol/L (ref 97–108)
Creatinine: 0.93 mg/dL (ref 0.70–1.30)
ESTIMATED GLOMERULAR FILTRATION RATE: >60 mL/min/{1.73_m2} (ref >60)
Globulin: 3.9 g/dL (ref 2.0–4.0)
Glucose: 151 mg/dL — ABNORMAL HIGH (ref 65–100)
Potassium: 3.4 mmol/L — ABNORMAL LOW (ref 3.5–5.1)
Sodium: 139 mmol/L (ref 136–145)
Total Bilirubin: 0.2 mg/dL (ref 0.2–1.0)
Total Protein: 7.3 g/dL (ref 6.4–8.2)

## 2021-10-13 LAB — CBC WITH AUTO DIFFERENTIAL
Basophils %: 1 % (ref 0–1)
Basophils Absolute: 0 10*3/uL (ref 0.0–0.1)
Eosinophils %: 2 % (ref 0–7)
Eosinophils Absolute: 0.2 10*3/uL (ref 0.0–0.4)
Granulocyte Absolute Count: 0 10*3/uL (ref 0.00–0.04)
Hematocrit: 37.7 % (ref 36.6–50.3)
Hemoglobin: 12.9 g/dL (ref 12.1–17.0)
Immature Granulocytes: 0 % (ref 0–0.5)
Lymphocytes %: 22 % (ref 12–49)
Lymphocytes Absolute: 1.8 10*3/uL (ref 0.8–3.5)
MCH: 28.9 PG (ref 26.0–34.0)
MCHC: 34.2 g/dL (ref 30.0–36.5)
MCV: 84.3 FL (ref 80.0–99.0)
MPV: 9.5 FL (ref 8.9–12.9)
Monocytes %: 10 % (ref 5–13)
Monocytes Absolute: 0.8 10*3/uL (ref 0.0–1.0)
NRBC Absolute: 0 10*3/uL (ref 0.00–0.01)
Neutrophils %: 65 % (ref 32–75)
Neutrophils Absolute: 5.1 10*3/uL (ref 1.8–8.0)
Nucleated RBCs: 0 PER 100 WBC
Platelets: 205 10*3/uL (ref 150–400)
RBC: 4.47 M/uL (ref 4.10–5.70)
RDW: 12.9 % (ref 11.5–14.5)
WBC: 7.9 10*3/uL (ref 4.1–11.1)

## 2021-10-13 LAB — LIPASE
Lipase: 217 U/L (ref 73–393)
Lipase: 217 U/L (ref 73–393)

## 2021-10-13 LAB — URINALYSIS W/ REFLEX CULTURE
BACTERIA, URINE: NEGATIVE /hpf
Bacteria: NEGATIVE /hpf
Bilirubin, Urine: NEGATIVE
Bilirubin: NEGATIVE
Glucose, Ur: NEGATIVE mg/dL
Glucose: NEGATIVE mg/dL
Ketone: NEGATIVE mg/dL
Ketones, Urine: NEGATIVE mg/dL
Leukocyte Esterase, Urine: NEGATIVE
Leukocyte Esterase: NEGATIVE
Nitrite, Urine: NEGATIVE
Nitrites: NEGATIVE
Protein, UA: NEGATIVE mg/dL
Protein: NEGATIVE mg/dL
Specific Gravity, UA: 1.01 (ref 1.003–1.030)
Specific gravity: 1.01 (ref 1.003–1.030)
Urobilinogen, UA, POCT: 0.2 EU/dL (ref 0.2–1.0)
Urobilinogen: 0.2 EU/dL (ref 0.2–1.0)
pH (UA): 6.5 (ref 5.0–8.0)
pH, UA: 6.5 (ref 5.0–8.0)

## 2021-10-13 LAB — MAGNESIUM
Magnesium: 1.9 mg/dL (ref 1.6–2.4)
Magnesium: 1.9 mg/dL (ref 1.6–2.4)

## 2021-10-13 LAB — TSH 3RD GENERATION
TSH: 3.32 u[IU]/mL (ref 0.36–3.74)
TSH: 3.32 u[IU]/mL (ref 0.36–3.74)

## 2021-10-13 LAB — TROPONIN, HIGH SENSITIVITY
Troponin, High Sensitivity: 107 ng/L — ABNORMAL HIGH (ref 0–76)
Troponin, High Sensitivity: 99 ng/L — ABNORMAL HIGH (ref 0–76)
Troponin, High Sensitivity: 102 ng/L — ABNORMAL HIGH (ref 0–76)

## 2021-10-13 LAB — PROBNP, N-TERMINAL: BNP: 142 pg/mL — ABNORMAL HIGH (ref <125)

## 2021-10-13 LAB — METABOLIC PANEL, COMPREHENSIVE
A-G Ratio: 0.9 — ABNORMAL LOW (ref 1.1–2.2)
ALT (SGPT): 24 U/L (ref 12–78)
AST (SGOT): 15 U/L (ref 15–37)
Albumin: 3.4 g/dL — ABNORMAL LOW (ref 3.5–5.0)
Alk. phosphatase: 47 U/L (ref 45–117)
Anion gap: 7 mmol/L (ref 5–15)
BUN/Creatinine ratio: 11 — ABNORMAL LOW (ref 12–20)
BUN: 10 mg/dL (ref 6–20)
Bilirubin, total: 0.2 mg/dL (ref 0.2–1.0)
CO2: 31 mmol/L (ref 21–32)
Calcium: 9.1 mg/dL (ref 8.5–10.1)
Chloride: 101 mmol/L (ref 97–108)
Creatinine: 0.93 mg/dL (ref 0.70–1.30)
Globulin: 3.9 g/dL (ref 2.0–4.0)
Glucose: 151 mg/dL — ABNORMAL HIGH (ref 65–100)
Potassium: 3.4 mmol/L — ABNORMAL LOW (ref 3.5–5.1)
Protein, total: 7.3 g/dL (ref 6.4–8.2)
Sodium: 139 mmol/L (ref 136–145)
eGFR: >60 mL/min/{1.73_m2} (ref >60)

## 2021-10-13 LAB — CBC WITH AUTOMATED DIFF
ABS. BASOPHILS: 0 10*3/uL (ref 0.0–0.1)
ABS. EOSINOPHILS: 0.2 10*3/uL (ref 0.0–0.4)
ABS. IMM. GRANS.: 0 10*3/uL (ref 0.00–0.04)
ABS. LYMPHOCYTES: 1.8 10*3/uL (ref 0.8–3.5)
ABS. MONOCYTES: 0.8 10*3/uL (ref 0.0–1.0)
ABS. NEUTROPHILS: 5.1 10*3/uL (ref 1.8–8.0)
ABSOLUTE NRBC: 0 10*3/uL (ref 0.00–0.01)
BASOPHILS: 1 % (ref 0–1)
EOSINOPHILS: 2 % (ref 0–7)
HCT: 37.7 % (ref 36.6–50.3)
HGB: 12.9 g/dL (ref 12.1–17.0)
IMMATURE GRANULOCYTES: 0 % (ref 0–0.5)
LYMPHOCYTES: 22 % (ref 12–49)
MCH: 28.9 PG (ref 26.0–34.0)
MCHC: 34.2 g/dL (ref 30.0–36.5)
MCV: 84.3 FL (ref 80.0–99.0)
MONOCYTES: 10 % (ref 5–13)
MPV: 9.5 FL (ref 8.9–12.9)
NEUTROPHILS: 65 % (ref 32–75)
NRBC: 0 PER 100 WBC
PLATELET: 205 10*3/uL (ref 150–400)
RBC: 4.47 M/uL (ref 4.10–5.70)
RDW: 12.9 % (ref 11.5–14.5)
WBC: 7.9 10*3/uL (ref 4.1–11.1)

## 2021-10-13 LAB — TROPONIN-HIGH SENSITIVITY
Troponin-High Sensitivity: 107 ng/L — ABNORMAL HIGH (ref 0–76)
Troponin-High Sensitivity: 99 ng/L — ABNORMAL HIGH (ref 0–76)
Troponin-High Sensitivity: 102 ng/L — ABNORMAL HIGH (ref 0–76)

## 2021-10-13 LAB — NT-PRO BNP: NT pro-BNP: 142 pg/mL — ABNORMAL HIGH (ref <125)

## 2021-10-13 MED ORDER — ACETAMINOPHEN 325 MG TABLET
325 mg | Freq: Four times a day (QID) | ORAL | Status: DC | PRN
Start: 2021-10-13 — End: 2021-10-14

## 2021-10-13 MED ORDER — ONDANSETRON 4 MG TAB, RAPID DISSOLVE
4 mg | Freq: Three times a day (TID) | ORAL | Status: DC | PRN
Start: 2021-10-13 — End: 2021-10-14

## 2021-10-13 MED ORDER — GLUCAGON 1 MG INJECTION
1 mg | INTRAMUSCULAR | Status: DC | PRN
Start: 2021-10-13 — End: 2021-10-14

## 2021-10-13 MED ORDER — DEXTROSE 50% IN WATER (D50W) IV SYRG
INTRAVENOUS | Status: DC | PRN
Start: 2021-10-13 — End: 2021-10-14

## 2021-10-13 MED ORDER — GLUCOSE 4 GRAM CHEWABLE TAB
4 gram | ORAL | Status: DC | PRN
Start: 2021-10-13 — End: 2021-10-14

## 2021-10-13 MED ORDER — ONDANSETRON (PF) 4 MG/2 ML INJECTION
4 mg/2 mL | INTRAMUSCULAR | Status: AC
Start: 2021-10-13 — End: 2021-10-13
  Administered 2021-10-13: 12:00:00 via INTRAVENOUS

## 2021-10-13 MED ORDER — ALLOPURINOL 300 MG TAB
300 mg | Freq: Every day | ORAL | Status: DC
Start: 2021-10-13 — End: 2021-10-14
  Administered 2021-10-14: 12:00:00 via ORAL

## 2021-10-13 MED ORDER — ONDANSETRON (PF) 4 MG/2 ML INJECTION
4 mg/2 mL | Freq: Four times a day (QID) | INTRAMUSCULAR | Status: DC | PRN
Start: 2021-10-13 — End: 2021-10-14

## 2021-10-13 MED ORDER — ENOXAPARIN 30 MG/0.3 ML SUB-Q SYRINGE
30 mg/0.3 mL | Freq: Two times a day (BID) | SUBCUTANEOUS | Status: DC
Start: 2021-10-13 — End: 2021-10-13
  Administered 2021-10-13: 17:00:00 via SUBCUTANEOUS

## 2021-10-13 MED ORDER — SODIUM CHLORIDE 0.9 % IJ SYRG
INTRAMUSCULAR | Status: DC | PRN
Start: 2021-10-13 — End: 2021-10-14

## 2021-10-13 MED ORDER — INSULIN LISPRO 100 UNIT/ML INJECTION
100 unit/mL | Freq: Four times a day (QID) | SUBCUTANEOUS | Status: DC
Start: 2021-10-13 — End: 2021-10-14

## 2021-10-13 MED ORDER — PANTOPRAZOLE 40 MG IV SOLR
40 mg | Freq: Two times a day (BID) | INTRAVENOUS | Status: DC
Start: 2021-10-13 — End: 2021-10-14
  Administered 2021-10-13 – 2021-10-14 (×3): via INTRAVENOUS

## 2021-10-13 MED ORDER — SODIUM CHLORIDE 0.9 % IJ SYRG
Freq: Three times a day (TID) | INTRAMUSCULAR | Status: DC
Start: 2021-10-13 — End: 2021-10-14
  Administered 2021-10-13 – 2021-10-14 (×3): via INTRAVENOUS

## 2021-10-13 MED ORDER — AMLODIPINE 10 MG TAB
10 mg | Freq: Every day | ORAL | Status: DC
Start: 2021-10-13 — End: 2021-10-14
  Administered 2021-10-14: 12:00:00 via ORAL

## 2021-10-13 MED ORDER — ALUM-MAG HYDROXIDE-SIMETH 200 MG-200 MG-20 MG/5 ML ORAL SUSP
200-200-20 mg/5 mL | Freq: Three times a day (TID) | ORAL | Status: DC
Start: 2021-10-13 — End: 2021-10-14
  Administered 2021-10-13 – 2021-10-14 (×4): via ORAL

## 2021-10-13 MED ORDER — NS WITH POTASSIUM CHLORIDE 20 MEQ/L IV
20 mEq/L | INTRAVENOUS | Status: DC
Start: 2021-10-13 — End: 2021-10-13
  Administered 2021-10-13 (×2): via INTRAVENOUS

## 2021-10-13 MED ORDER — METOPROLOL SUCCINATE SR 25 MG 24 HR TAB
25 mg | Freq: Every day | ORAL | Status: DC
Start: 2021-10-13 — End: 2021-10-14
  Administered 2021-10-14: 12:00:00 via ORAL

## 2021-10-13 MED ORDER — OXYCODONE-ACETAMINOPHEN 10 MG-325 MG TAB
10-325 mg | Freq: Four times a day (QID) | ORAL | Status: DC | PRN
Start: 2021-10-13 — End: 2021-10-14

## 2021-10-13 MED ORDER — MORPHINE 4 MG/ML INTRAVENOUS SOLUTION
4 mg/mL | INTRAVENOUS | Status: AC
Start: 2021-10-13 — End: 2021-10-13
  Administered 2021-10-13: 11:00:00 via INTRAVENOUS

## 2021-10-13 MED ORDER — SODIUM CHLORIDE 0.9 % IV
INTRAVENOUS | Status: AC
Start: 2021-10-13 — End: 2021-10-13
  Administered 2021-10-13: 12:00:00 via INTRAVENOUS

## 2021-10-13 MED ORDER — POLYETHYLENE GLYCOL 3350 17 GRAM (100 %) ORAL POWDER PACKET
17 gram | Freq: Every day | ORAL | Status: DC | PRN
Start: 2021-10-13 — End: 2021-10-14
  Administered 2021-10-13: 17:00:00 via ORAL

## 2021-10-13 MED ORDER — ACETAMINOPHEN 650 MG RECTAL SUPPOSITORY
650 mg | Freq: Four times a day (QID) | RECTAL | Status: DC | PRN
Start: 2021-10-13 — End: 2021-10-14

## 2021-10-13 MED ORDER — ENOXAPARIN 40 MG/0.4 ML SUB-Q SYRINGE
40 mg/0.4 mL | Freq: Every day | SUBCUTANEOUS | Status: DC
Start: 2021-10-13 — End: 2021-10-14
  Administered 2021-10-14: 12:00:00 via SUBCUTANEOUS

## 2021-10-13 MED ORDER — HYDRALAZINE 25 MG TAB
25 mg | Freq: Three times a day (TID) | ORAL | Status: DC
Start: 2021-10-13 — End: 2021-10-14
  Administered 2021-10-13 – 2021-10-14 (×3): via ORAL

## 2021-10-13 MED ORDER — SIMETHICONE 80 MG CHEWABLE TAB
80 mg | ORAL | Status: AC
Start: 2021-10-13 — End: 2021-10-13
  Administered 2021-10-13: 21:00:00 via ORAL

## 2021-10-13 MED ORDER — PRAVASTATIN 20 MG TAB
20 mg | Freq: Every day | ORAL | Status: DC
Start: 2021-10-13 — End: 2021-10-14
  Administered 2021-10-14: 12:00:00 via ORAL

## 2021-10-13 MED ORDER — IOPAMIDOL 76 % IV SOLN
370 mg iodine /mL (76 %) | Freq: Once | INTRAVENOUS | Status: AC
Start: 2021-10-13 — End: 2021-10-13
  Administered 2021-10-13: 13:00:00 via INTRAVENOUS

## 2021-10-13 MED FILL — ISOVUE-370  76 % INTRAVENOUS SOLUTION: 370 mg iodine /mL (76 %) | INTRAVENOUS | Qty: 100

## 2021-10-13 MED FILL — NS WITH POTASSIUM CHLORIDE 20 MEQ/L IV: 20 mEq/L | INTRAVENOUS | Qty: 1000

## 2021-10-13 MED FILL — ENOXAPARIN 30 MG/0.3 ML SUB-Q SYRINGE: 30 mg/0.3 mL | SUBCUTANEOUS | Qty: 0.3

## 2021-10-13 MED FILL — NORMAL SALINE FLUSH 0.9 % INJECTION SYRINGE: INTRAMUSCULAR | Qty: 40

## 2021-10-13 MED FILL — SODIUM CHLORIDE 0.9 % IV: INTRAVENOUS | Qty: 1000

## 2021-10-13 MED FILL — MI-ACID GAS RELIEF (SIMETHICONE) 80 MG CHEWABLE TABLET: 80 mg | ORAL | Qty: 1

## 2021-10-13 MED FILL — POLYETHYLENE GLYCOL 3350 17 GRAM (100 %) ORAL POWDER PACKET: 17 gram | ORAL | Qty: 1

## 2021-10-13 MED FILL — ONDANSETRON (PF) 4 MG/2 ML INJECTION: 4 mg/2 mL | INTRAMUSCULAR | Qty: 2

## 2021-10-13 MED FILL — MORPHINE 4 MG/ML SYRINGE: 4 mg/mL | INTRAMUSCULAR | Qty: 1

## 2021-10-13 MED FILL — PANTOPRAZOLE 40 MG IV SOLR: 40 mg | INTRAVENOUS | Qty: 40

## 2021-10-13 MED FILL — HYDRALAZINE 25 MG TAB: 25 mg | ORAL | Qty: 1

## 2021-10-13 MED FILL — ALUM-MAG HYDROXIDE-SIMETH 200 MG-200 MG-20 MG/5 ML ORAL SUSP: 200-200-20 mg/5 mL | ORAL | Qty: 30

## 2021-10-13 NOTE — Progress Notes (Signed)
 Care Management Interventions  PCP Verified by CM: Yes  Mode of Transport at Discharge: Other (see comment) (brother)  Transition of Care Consult (CM Consult): Discharge Planning  Support Systems: Other Family Member(s)  Confirm Follow Up Transport: Self  The Plan for Transition of Care is Related to the Following Treatment Goals : Patient lives alone and is independent with his own care. No discharge planning needs identified.  Discharge Location  Patient Expects to be Discharged to:: Home

## 2021-10-13 NOTE — Progress Notes (Signed)
 Problem: Nausea/Vomiting (Adult)  Goal: *Absence of nausea/vomiting  Outcome: Progressing Towards Goal     Problem: Patient Education: Go to Patient Education Activity  Goal: Patient/Family Education  Outcome: Progressing Towards Goal     Problem: Diabetes Maintenance:Admission  Goal: Activity/Safety  Outcome: Progressing Towards Goal  Goal: Diagnostic Tests/Procedures  Outcome: Progressing Towards Goal  Goal: Nutrition  Outcome: Progressing Towards Goal  Goal: Medications  Outcome: Progressing Towards Goal  Goal: Treatments/Interventions/Procedures  Outcome: Progressing Towards Goal     Problem: Diabetes Maintenance:Ongoing  Goal: Activity/Safety  Outcome: Progressing Towards Goal  Goal: Nutrition  Outcome: Progressing Towards Goal  Goal: Medications  Outcome: Progressing Towards Goal  Goal: Treatments/Interventsions/Procedures  Outcome: Progressing Towards Goal  Goal: *Blood Glucose 80 to 180 md/dl  Outcome: Progressing Towards Goal     Problem: Diabetes Maintenance:Discharge Outcomes  Goal: *Describes follow-up/return visits to physicians  Outcome: Progressing Towards Goal  Goal: *Blood glucose at patient's target range or approaching  Outcome: Progressing Towards Goal  Goal: *Aware of nutrition guidelines  Outcome: Progressing Towards Goal  Goal: *Verbalizes information about medication  Description: Verbalizes name, dosage, time, side effects, and number of days to  continue medications.  Outcome: Progressing Towards Goal  Goal: *Describes goals, rules, symptoms, and treatments  Description: Describes blood glucose goals, monitoring, sick day rules,  hypo/hyperglycemia prevention, symptoms, and treatment  Outcome: Progressing Towards Goal  Goal: *Describes available outpatient diabetes resources and support systems  Outcome: Progressing Towards Goal

## 2021-10-13 NOTE — Progress Notes (Signed)
Problem: Nausea/Vomiting (Adult)  Goal: *Absence of nausea/vomiting  Outcome: Progressing Towards Goal     Problem: Patient Education: Go to Patient Education Activity  Goal: Patient/Family Education  Outcome: Progressing Towards Goal

## 2021-10-13 NOTE — Progress Notes (Signed)
 Progress Notes by Eda Chew, PHARMD at 10/13/21 1131                Author: Eda Chew, PHARMD  Service: Pharmacist  Author Type: Pharmacist       Filed: 10/13/21 1131  Date of Service: 10/13/21 1131  Status: Signed          Editor: Eda Chew, PHARMD (Pharmacist)                       Pharmacist Review and Automatic Dose Adjustment of Prophylactic Enoxaparin      *Review reason for admission/hospital problem list*      The reviewing pharmacist has made an adjustment to the ordered enoxaparin dose or converted to UFH per the approved [facility] protocol and table as identified below.           Adrian Saunders is a 66 y.o. male.       No lab exists for component: CREATININE      Estimated Creatinine Clearance: 94.1 mL/min (based on SCr of 0.93 mg/dL).      Height:      Ht Readings from Last 1 Encounters:        10/13/21  167.6 cm (66)       Weight:     Wt Readings from Last 1 Encounters:        10/13/21  108.9 kg (240 lb)                         Plan: Based upon the patient's weight and renal function, the ordered enoxaparin dose of 40mg  q24h  has been changed/converted to 30 mg q 12 h          Thank you,   Chew Eda, PHARMD.

## 2021-10-13 NOTE — ED Notes (Signed)
 Pt reports SOB, vomiting and mid chest pain that started after having a colonoscopy wednesday.

## 2021-10-13 NOTE — Progress Notes (Signed)
 Problem: Nausea/Vomiting (Adult)  Goal: *Absence of nausea/vomiting  10/13/2021 2013 by Halaman, Josefa, RN  Outcome: Progressing Towards Goal  10/13/2021 2013 by Halaman, Josefa, RN  Outcome: Progressing Towards Goal     Problem: Patient Education: Go to Patient Education Activity  Goal: Patient/Family Education  10/13/2021 2013 by Halaman, Josefa, RN  Outcome: Progressing Towards Goal  10/13/2021 2013 by Halaman, Josefa, RN  Outcome: Progressing Towards Goal     Problem: Diabetes Maintenance:Admission  Goal: Activity/Safety  10/13/2021 2013 by Halaman, Josefa, RN  Outcome: Progressing Towards Goal  10/13/2021 2013 by Halaman, Josefa, RN  Outcome: Progressing Towards Goal  Goal: Diagnostic Tests/Procedures  10/13/2021 2013 by Halaman, Josefa, RN  Outcome: Progressing Towards Goal  10/13/2021 2013 by Halaman, Josefa, RN  Outcome: Progressing Towards Goal  Goal: Nutrition  10/13/2021 2013 by Halaman, Josefa, RN  Outcome: Progressing Towards Goal  10/13/2021 2013 by Halaman, Josefa, RN  Outcome: Progressing Towards Goal  Goal: Medications  10/13/2021 2013 by Halaman, Josefa, RN  Outcome: Progressing Towards Goal  10/13/2021 2013 by Halaman, Josefa, RN  Outcome: Progressing Towards Goal  Goal: Treatments/Interventions/Procedures  10/13/2021 2013 by Halaman, Josefa, RN  Outcome: Progressing Towards Goal  10/13/2021 2013 by Halaman, Josefa, RN  Outcome: Progressing Towards Goal     Problem: Diabetes Maintenance:Ongoing  Goal: Activity/Safety  10/13/2021 2013 by Halaman, Josefa, RN  Outcome: Progressing Towards Goal  10/13/2021 2013 by Halaman, Josefa, RN  Outcome: Progressing Towards Goal  Goal: Nutrition  10/13/2021 2013 by Halaman, Josefa, RN  Outcome: Progressing Towards Goal  10/13/2021 2013 by Halaman, Josefa, RN  Outcome: Progressing Towards Goal  Goal: Medications  10/13/2021 2013 by Halaman, Josefa, RN  Outcome: Progressing Towards Goal  10/13/2021 2013 by Halaman, Josefa, RN  Outcome: Progressing Towards Goal  Goal:  Treatments/Interventsions/Procedures  10/13/2021 2013 by Halaman, Josefa, RN  Outcome: Progressing Towards Goal  10/13/2021 2013 by Halaman, Josefa, RN  Outcome: Progressing Towards Goal  Goal: *Blood Glucose 80 to 180 md/dl  [id number] by Halaman, Josefa, RN  Outcome: Progressing Towards Goal  10/13/2021 2013 by Halaman, Josefa, RN  Outcome: Progressing Towards Goal     Problem: Diabetes Maintenance:Discharge Outcomes  Goal: *Describes follow-up/return visits to physicians  10/13/2021 2013 by Halaman, Josefa, RN  Outcome: Progressing Towards Goal  10/13/2021 2013 by Halaman, Josefa, RN  Outcome: Progressing Towards Goal  Goal: *Blood glucose at patient's target range or approaching  10/13/2021 2013 by Halaman, Josefa, RN  Outcome: Progressing Towards Goal  10/13/2021 2013 by Halaman, Josefa, RN  Outcome: Progressing Towards Goal  Goal: *Aware of nutrition guidelines  10/13/2021 2013 by Halaman, Josefa, RN  Outcome: Progressing Towards Goal  10/13/2021 2013 by Halaman, Josefa, RN  Outcome: Progressing Towards Goal  Goal: *Verbalizes information about medication  Description: Verbalizes name, dosage, time, side effects, and number of days to  continue medications.  10/13/2021 2013 by Halaman, Josefa, RN  Outcome: Progressing Towards Goal  10/13/2021 2013 by Halaman, Josefa, RN  Outcome: Progressing Towards Goal  Goal: *Describes goals, rules, symptoms, and treatments  Description: Describes blood glucose goals, monitoring, sick day rules,  hypo/hyperglycemia prevention, symptoms, and treatment  10/13/2021 2013 by Halaman, Josefa, RN  Outcome: Progressing Towards Goal  10/13/2021 2013 by Halaman, Josefa, RN  Outcome: Progressing Towards Goal  Goal: *Describes available outpatient diabetes resources and support systems  10/13/2021 2013 by Halaman, Josefa, RN  Outcome: Progressing Towards Goal  10/13/2021 2013 by Halaman, Josefa, RN  Outcome: Progressing Towards Goal

## 2021-10-13 NOTE — ED Notes (Signed)
 Report called to [facility] RN on PCU. All questions answered at that time.

## 2021-10-14 ENCOUNTER — Observation Stay: Admit: 2021-10-14 | Payer: MEDICARE | Primary: Student in an Organized Health Care Education/Training Program

## 2021-10-14 LAB — ECHO (TTE) COMPLETE (PRN CONTRAST/BUBBLE/STRAIN/3D)
AR Max Velocity PISA: 4.7 m/s
AR PHT: 341.5 millisecond
AV Area by Peak Velocity: 3.7 cm2
AV Area by VTI: 3.5 cm2
AV Mean Gradient: 9 mmHg
AV Mean Velocity: 1.4 m/s
AV Peak Gradient: 15 mmHg
AV Peak Velocity: 1.9 m/s
AV VTI: 41 cm
AV Velocity Ratio: 1
AVA/BSA Peak Velocity: 1.7 cm2/m2
AVA/BSA VTI: 1.6 cm2/m2
Ao Root Index: 1.57 cm/m2
Aortic Root: 3.4 cm
E/E' Lateral: 11.86
E/E' Ratio (Averaged): 14.23
E/E' Septal: 16.6
EF BP: 62 % (ref 55–100)
Fractional Shortening 2D: 37 % (ref 28–44)
Global Longitudinal Strain: -11.6 %
Global Longitudinal Strain: -12.4 %
Global Longitudinal Strain: -13.7 %
Global Longitudinal Strain: -17.2 %
IVSd: 1.5 cm — AB (ref 0.6–1)
LA Diameter: 4.1 cm
LA Size Index: 1.9 cm/m2
LA Volume A-L A4C: 36 mL (ref 18–58)
LA Volume A-L A4C: 46 mL (ref 18–58)
LA Volume A/L: 42 mL
LA Volume BP: 43 mL (ref 18–58)
LA Volume Index A-L A2C: 21 mL/m2 (ref 16–34)
LA Volume Index A-L A4C: 17 mL/m2 (ref 16–34)
LA Volume Index A/L: 19 mL/m2 (ref 16–34)
LA Volume Index BP: 20 ml/m2 (ref 16–34)
LA/AO Root Ratio: 1.21
LV E' Lateral Velocity: 7 cm/s
LV E' Septal Velocity: 5 cm/s
LV EDV A2C: 55 mL
LV EDV A4C: 82 mL
LV EDV BP: 72 mL (ref 67–155)
LV EDV Index A2C: 25 mL/m2
LV EDV Index A4C: 38 mL/m2
LV EDV Index BP: 33 mL/m2
LV ESV A2C: 25 mL
LV ESV A4C: 29 mL
LV ESV BP: 27 mL (ref 22–58)
LV ESV Index A2C: 12 mL/m2
LV ESV Index A4C: 13 mL/m2
LV ESV Index BP: 13 mL/m2
LV Ejection Fraction A2C: 54 %
LV Ejection Fraction A4C: 65 %
LV Mass 2D Index: 105.8 g/m2 (ref 49–115)
LV Mass 2D: 228.6 g — AB (ref 88–224)
LV RWT Ratio: 0.68
LVIDd Index: 1.9 cm/m2
LVIDd: 4.1 cm — AB (ref 4.2–5.9)
LVIDs Index: 1.2 cm/m2
LVIDs: 2.6 cm
LVOT Area: 3.8 cm2
LVOT Diameter: 2.2 cm
LVOT Mean Gradient: 10 mmHg
LVOT Peak Gradient: 14 mmHg
LVOT Peak Velocity: 1.9 m/s
LVOT SV: 148.9 ml
LVOT Stroke Volume Index: 69 mL/m2
LVOT VTI: 39.2 cm
LVOT:AV VTI Index: 0.96
LVPWd: 1.4 cm — AB (ref 0.6–1)
MV A Velocity: 0.96 m/s
MV E Velocity: 0.83 m/s
MV E Wave Deceleration Time: 248 ms
MV E/A: 0.86
RA Area 4C: 15.9 cm2
RV Free Wall Peak S': 15 cm/s
RVIDd: 3.4 cm
TAPSE: 2.5 cm

## 2021-10-14 LAB — CBC WITH AUTO DIFFERENTIAL
Basophils %: 1 % (ref 0–1)
Basophils Absolute: 0 10*3/uL (ref 0.0–0.1)
Eosinophils %: 2 % (ref 0–7)
Eosinophils Absolute: 0.2 10*3/uL (ref 0.0–0.4)
Granulocyte Absolute Count: 0 10*3/uL (ref 0.00–0.04)
Hematocrit: 37.3 % (ref 36.6–50.3)
Hemoglobin: 12.4 g/dL (ref 12.1–17.0)
Immature Granulocytes: 0 % (ref 0–0.5)
Lymphocytes %: 24 % (ref 12–49)
Lymphocytes Absolute: 1.9 10*3/uL (ref 0.8–3.5)
MCH: 28.6 PG (ref 26.0–34.0)
MCHC: 33.2 g/dL (ref 30.0–36.5)
MCV: 85.9 FL (ref 80.0–99.0)
MPV: 9.9 FL (ref 8.9–12.9)
Monocytes %: 10 % (ref 5–13)
Monocytes Absolute: 0.8 10*3/uL (ref 0.0–1.0)
NRBC Absolute: 0 10*3/uL (ref 0.00–0.01)
Neutrophils %: 63 % (ref 32–75)
Neutrophils Absolute: 4.9 10*3/uL (ref 1.8–8.0)
Nucleated RBCs: 0 PER 100 WBC
Platelets: 214 10*3/uL (ref 150–400)
RBC: 4.34 M/uL (ref 4.10–5.70)
RDW: 13 % (ref 11.5–14.5)
WBC: 7.8 10*3/uL (ref 4.1–11.1)

## 2021-10-14 LAB — LIPID PANEL
CHOL/HDL Ratio: 3.4 (ref 0.0–5.0)
Chol/HDL Ratio: 3.4 (ref 0.0–5.0)
Cholesterol, Total: 157 mg/dL (ref <200)
Cholesterol, total: 157 mg/dL (ref <200)
HDL Cholesterol: 46 mg/dL
HDL: 46 mg/dL
LDL Calculated: 104.4 mg/dL — ABNORMAL HIGH (ref 0–100)
LDL, calculated: 104.4 mg/dL — ABNORMAL HIGH (ref 0–100)
Triglyceride: 33 mg/dL (ref <150)
Triglycerides: 33 mg/dL (ref <150)
VLDL Cholesterol Calculated: 6.6 mg/dL
VLDL, calculated: 6.6 mg/dL

## 2021-10-14 LAB — BASIC METABOLIC PANEL
Anion Gap: 5 mmol/L (ref 5–15)
BUN: 8 mg/dL (ref 6–20)
Bun/Cre Ratio: 9 — ABNORMAL LOW (ref 12–20)
CO2: 32 mmol/L (ref 21–32)
Calcium: 8.8 mg/dL (ref 8.5–10.1)
Chloride: 101 mmol/L (ref 97–108)
Creatinine: 0.92 mg/dL (ref 0.70–1.30)
ESTIMATED GLOMERULAR FILTRATION RATE: >60 mL/min/{1.73_m2} (ref >60)
Glucose: 137 mg/dL — ABNORMAL HIGH (ref 65–100)
Potassium: 4 mmol/L (ref 3.5–5.1)
Sodium: 138 mmol/L (ref 136–145)

## 2021-10-14 LAB — EKG 12-LEAD
Atrial Rate: 74 {beats}/min
P Axis: 39 degrees
P-R Interval: 172 ms
Q-T Interval: 401 ms
QRS Duration: 91 ms
QTc Calculation (Bazett): 445 ms
R Axis: 7 degrees
T Axis: -8 degrees
Ventricular Rate: 74 {beats}/min

## 2021-10-14 LAB — HEMOGLOBIN A1C W/EAG
Hemoglobin A1C: 7.1 % — ABNORMAL HIGH (ref 4.0–5.6)
eAG: 157 mg/dL

## 2021-10-14 LAB — POCT GLUCOSE: POC Glucose: 108 mg/dL — ABNORMAL HIGH (ref 65–100)

## 2021-10-14 LAB — MAGNESIUM
Magnesium: 1.9 mg/dL (ref 1.6–2.4)
Magnesium: 1.9 mg/dL (ref 1.6–2.4)

## 2021-10-14 LAB — ECHO ADULT COMPLETE
AR Max Velocity PISA: 4.7 m/s
AR PHT: 341.5 millisecond
AV Area by Peak Velocity: 3.7 cm2
AV Area by VTI: 3.5 cm2
AV Mean Gradient: 9 mmHg
AV Mean Velocity: 1.4 m/s
AV Peak Gradient: 15 mmHg
AV Peak Velocity: 1.9 m/s
AV VTI: 41 cm
AV Velocity Ratio: 1
AVA/BSA Peak Velocity: 1.7 cm2/m2
AVA/BSA VTI: 1.6 cm2/m2
Ao Root Index: 1.57 cm/m2
Aortic Root: 3.4 cm
E/E' Lateral: 11.86
E/E' Ratio (Averaged): 14.23
E/E' Septal: 16.6
EF BP: 62 % (ref 55–100)
Fractional Shortening 2D: 37 % (ref 28–44)
Global Longitudinal Strain: -11.6 %
Global Longitudinal Strain: -12.4 %
Global Longitudinal Strain: -13.7 %
Global Longitudinal Strain: -17.2 %
IVSd: 1.5 cm — AB (ref 0.6–1.0)
LA Diameter: 4.1 cm
LA Size Index: 1.9 cm/m2
LA Volume 2C: 46 mL (ref 18–58)
LA Volume 4C: 36 mL (ref 18–58)
LA Volume A/L: 42 mL
LA Volume BP: 43 mL (ref 18–58)
LA Volume Index 2C: 21 mL/m2 (ref 16–34)
LA Volume Index 4C: 17 mL/m2 (ref 16–34)
LA Volume Index A/L: 19 mL/m2 (ref 16–34)
LA Volume Index BP: 20 ml/m2 (ref 16–34)
LA/AO Root Ratio: 1.21
LV E' Lateral Velocity: 7 cm/s
LV E' Septal Velocity: 5 cm/s
LV EDV A2C: 55 mL
LV EDV A4C: 82 mL
LV EDV BP: 72 mL (ref 67–155)
LV EDV Index A2C: 25 mL/m2
LV EDV Index A4C: 38 mL/m2
LV EDV Index BP: 33 mL/m2
LV ESV A2C: 25 mL
LV ESV A4C: 29 mL
LV ESV BP: 27 mL (ref 22–58)
LV ESV Index A2C: 12 mL/m2
LV ESV Index A4C: 13 mL/m2
LV ESV Index BP: 13 mL/m2
LV Ejection Fraction A2C: 54 %
LV Ejection Fraction A4C: 65 %
LV Mass 2D Index: 105.8 g/m2 (ref 49–115)
LV Mass 2D: 228.6 g — AB (ref 88–224)
LV RWT Ratio: 0.68
LVIDd Index: 1.9 cm/m2
LVIDd: 4.1 cm — AB (ref 4.2–5.9)
LVIDs Index: 1.2 cm/m2
LVIDs: 2.6 cm
LVOT Area: 3.8 cm2
LVOT Diameter: 2.2 cm
LVOT Mean Gradient: 10 mmHg
LVOT Peak Gradient: 14 mmHg
LVOT Peak Velocity: 1.9 m/s
LVOT SV: 148.9 ml
LVOT Stroke Volume Index: 69 mL/m2
LVOT VTI: 39.2 cm
LVOT:AV VTI Index: 0.96
LVPWd: 1.4 cm — AB (ref 0.6–1.0)
MV A Velocity: 0.96 m/s
MV E Velocity: 0.83 m/s
MV E Wave Deceleration Time: 248 ms
MV E/A: 0.86
RA Area 4C: 15.9 cm2
RV Free Wall Peak S': 15 cm/s
RVIDd: 3.4 cm
TAPSE: 2.5 cm (ref 1.7–?)

## 2021-10-14 LAB — CBC WITH AUTOMATED DIFF
ABS. BASOPHILS: 0 10*3/uL (ref 0.0–0.1)
ABS. EOSINOPHILS: 0.2 10*3/uL (ref 0.0–0.4)
ABS. IMM. GRANS.: 0 10*3/uL (ref 0.00–0.04)
ABS. LYMPHOCYTES: 1.9 10*3/uL (ref 0.8–3.5)
ABS. MONOCYTES: 0.8 10*3/uL (ref 0.0–1.0)
ABS. NEUTROPHILS: 4.9 10*3/uL (ref 1.8–8.0)
ABSOLUTE NRBC: 0 10*3/uL (ref 0.00–0.01)
BASOPHILS: 1 % (ref 0–1)
EOSINOPHILS: 2 % (ref 0–7)
HCT: 37.3 % (ref 36.6–50.3)
HGB: 12.4 g/dL (ref 12.1–17.0)
IMMATURE GRANULOCYTES: 0 % (ref 0–0.5)
LYMPHOCYTES: 24 % (ref 12–49)
MCH: 28.6 PG (ref 26.0–34.0)
MCHC: 33.2 g/dL (ref 30.0–36.5)
MCV: 85.9 FL (ref 80.0–99.0)
MONOCYTES: 10 % (ref 5–13)
MPV: 9.9 FL (ref 8.9–12.9)
NEUTROPHILS: 63 % (ref 32–75)
NRBC: 0 PER 100 WBC
PLATELET: 214 10*3/uL (ref 150–400)
RBC: 4.34 M/uL (ref 4.10–5.70)
RDW: 13 % (ref 11.5–14.5)
WBC: 7.8 10*3/uL (ref 4.1–11.1)

## 2021-10-14 LAB — EKG, 12 LEAD, INITIAL
Atrial Rate: 74 {beats}/min
Calculated P Axis: 39 degrees
Calculated R Axis: 7 degrees
Calculated T Axis: -8 degrees
P-R Interval: 172 ms
Q-T Interval: 401 ms
QRS Duration: 91 ms
QTC Calculation (Bezet): 445 ms
Ventricular Rate: 74 {beats}/min

## 2021-10-14 LAB — METABOLIC PANEL, BASIC
Anion gap: 5 mmol/L (ref 5–15)
BUN/Creatinine ratio: 9 — ABNORMAL LOW (ref 12–20)
BUN: 8 mg/dL (ref 6–20)
CO2: 32 mmol/L (ref 21–32)
Calcium: 8.8 mg/dL (ref 8.5–10.1)
Chloride: 101 mmol/L (ref 97–108)
Creatinine: 0.92 mg/dL (ref 0.70–1.30)
Glucose: 137 mg/dL — ABNORMAL HIGH (ref 65–100)
Potassium: 4 mmol/L (ref 3.5–5.1)
Sodium: 138 mmol/L (ref 136–145)
eGFR: >60 mL/min/{1.73_m2} (ref >60)

## 2021-10-14 LAB — HEMOGLOBIN A1C WITH EAG
Est. average glucose: 157 mg/dL
Hemoglobin A1c: 7.1 % — ABNORMAL HIGH (ref 4.0–5.6)

## 2021-10-14 LAB — GLUCOSE, POC: Glucose (POC): 108 mg/dL — ABNORMAL HIGH (ref 65–100)

## 2021-10-14 MED ORDER — LOSARTAN 50 MG TAB
50 mg | ORAL_TABLET | Freq: Every day | ORAL | 0 refills | Status: AC
Start: 2021-10-14 — End: ?

## 2021-10-14 MED ORDER — METOPROLOL SUCCINATE SR 50 MG 24 HR TAB
50 mg | ORAL_TABLET | Freq: Every day | ORAL | 1 refills | Status: AC
Start: 2021-10-14 — End: ?

## 2021-10-14 MED ORDER — LOSARTAN 25 MG TAB
25 mg | Freq: Every day | ORAL | Status: DC
Start: 2021-10-14 — End: 2021-10-14
  Administered 2021-10-14: 16:00:00 via ORAL

## 2021-10-14 MED ORDER — LOSARTAN 25 MG TAB
25 mg | Freq: Every day | ORAL | Status: DC
Start: 2021-10-14 — End: 2021-10-14

## 2021-10-14 MED ORDER — PRAVASTATIN 40 MG TAB
40 mg | ORAL_TABLET | Freq: Every day | ORAL | 0 refills | Status: AC
Start: 2021-10-14 — End: ?

## 2021-10-14 MED ORDER — METOPROLOL SUCCINATE SR 50 MG 24 HR TAB
50 mg | Freq: Every day | ORAL | Status: DC
Start: 2021-10-14 — End: 2021-10-14

## 2021-10-14 MED ORDER — ASPIRIN 81 MG TAB, DELAYED RELEASE
81 mg | Freq: Every day | ORAL | Status: DC
Start: 2021-10-14 — End: 2021-10-14
  Administered 2021-10-14: 12:00:00 via ORAL

## 2021-10-14 MED ORDER — ALUM-MAG HYDROXIDE-SIMETH 200 MG-200 MG-20 MG/5 ML ORAL SUSP
200-200-20 mg/5 mL | Freq: Three times a day (TID) | ORAL | 0 refills | Status: AC
Start: 2021-10-14 — End: 2021-10-21

## 2021-10-14 MED FILL — PANTOPRAZOLE 40 MG IV SOLR: 40 mg | INTRAVENOUS | Qty: 40

## 2021-10-14 MED FILL — AMLODIPINE 10 MG TAB: 10 mg | ORAL | Qty: 1

## 2021-10-14 MED FILL — HYDRALAZINE 25 MG TAB: 25 mg | ORAL | Qty: 1

## 2021-10-14 MED FILL — ENOXAPARIN 40 MG/0.4 ML SUB-Q SYRINGE: 40 mg/0.4 mL | SUBCUTANEOUS | Qty: 0.4

## 2021-10-14 MED FILL — LOSARTAN 25 MG TAB: 25 mg | ORAL | Qty: 2

## 2021-10-14 MED FILL — ALUM-MAG HYDROXIDE-SIMETH 200 MG-200 MG-20 MG/5 ML ORAL SUSP: 200-200-20 mg/5 mL | ORAL | Qty: 30

## 2021-10-14 MED FILL — ASPIRIN 81 MG TAB, DELAYED RELEASE: 81 mg | ORAL | Qty: 1

## 2021-10-14 MED FILL — METOPROLOL SUCCINATE SR 25 MG 24 HR TAB: 25 mg | ORAL | Qty: 1

## 2021-10-14 MED FILL — ALLOPURINOL 300 MG TAB: 300 mg | ORAL | Qty: 1

## 2021-10-14 MED FILL — PRAVASTATIN 20 MG TAB: 20 mg | ORAL | Qty: 1

## 2021-10-14 NOTE — Progress Notes (Signed)
 Spiritual Care Assessment/Progress Note  [facility]      NAME: Adrian Saunders      MRN: [id number]  AGE: 66 y.o. SEX: male  Religious Affiliation: Other   Language: English     10/14/2021     Total Time (in minutes): 30     Spiritual Assessment begun in [facility] through conversation with:         [x] Patient        []  Family    []  Friend(s)        Reason for Consult: Initial/Spiritual assessment, patient floor     Spiritual beliefs: (Please include comment if needed)     [x]  Identifies with a faith tradition:    Baptist     []  Supported by a faith community:            []  Claims no spiritual orientation:           []  Seeking spiritual identity:                []  Adheres to an individual form of spirituality:           []  Not able to assess:                           Identified resources for coping:      []  Prayer                               []  Music                  []  Guided Imagery     [x]  Family/friends                 []  Pet visits     []  Devotional reading                         []  Unknown     []  Other:                                               Interventions offered during this visit: (See comments for more details)    Patient Interventions: Life review/legacy, Normalization of emotional/spiritual concerns, Initial/Spiritual assessment, patient floor, Affirmation of faith           Plan of Care:     [x]  Support spiritual and/or cultural needs    []  Support AMD and/or advance care planning process      []  Support grieving process   []  Coordinate Rites and/or Rituals    []  Coordination with community clergy   []  No spiritual needs identified at this time   []  Detailed Plan of Care below (See Comments)  []  Make referral to Music Therapy  []  Make referral to Pet Therapy     []  Make referral to Addiction services  []  Make referral to [facility] Passages  []  Make referral to Spiritual Care Partner  []  No future visits requested        [x]  Contact Spiritual Care for  further referrals     Comments: Patient seen by Adrian Saunders, M. Div. Chaplain Intern for Initial Spiritual Care Assessment. Patient alone in room states he's  thankful to be here. Shared he is [facility] but have not been attending regularly. Expressed sadness and grief of medical concerns and [employer] and work. Shared history of God's blessings and thankful for family. Listened attentively providing words of comfort and encouragement. Will follow up as needed.  Adrian Saunders, M. Div. Chaplain Intern

## 2021-10-14 NOTE — Progress Notes (Signed)
 Problem: Nausea/Vomiting (Adult)  Goal: *Absence of nausea/vomiting  Outcome: Resolved/Met     Problem: Patient Education: Go to Patient Education Activity  Goal: Patient/Family Education  Outcome: Resolved/Met     Problem: Diabetes Maintenance:Admission  Goal: Nutrition  Outcome: Progressing Towards Goal  Goal: Medications  Outcome: Progressing Towards Goal  Goal: Treatments/Interventions/Procedures  Outcome: Progressing Towards Goal     Problem: Diabetes Maintenance:Ongoing  Goal: *Blood Glucose 80 to 180 md/dl  Outcome: Progressing Towards Goal

## 2021-10-14 NOTE — Consults (Signed)
 Nutrition Education    Educated on DM/Heart Healthy diet  Learners: Patient  Readiness: Acceptance  Method: Explanation, Handout, and Teachback  Response: Needs Reinforcement  Contact name and number provided.    Pt with fair background knowledge on dietary needs for health, however knowledge appears skewed. Consumes a grossly [employer] including chicken feet, livers, and gizzards; pigs feet and tails; oxtails; steak; plenty of fruit; noodles; grits; and produce he grew and processed himself. Boils or fries most of his foods. Admitted to trying several supplements friends have suggested to him.     RD educated on healthy-plate method of eating, encouraged pt to increase non-starchy vegetable consumption, limit starches to 25% of plate, and consume more fresh vs frozen as opposed to canned foods. Encouraged meatless meals and consuming more beans, nuts, seeds, and legumes for protein as opposed to animal meats. RD strongly suggested pt follow-up outpatient for further education.     Rachael Rockefeller  Contact Number: Ext [phone], or via [employer]

## 2021-10-14 NOTE — Other (Signed)
Therapy Evaluation by Adrian Clay, PT at 10/14/21 [phone]                Author: Donata Clay, PT  Service: Physical Therapy  Author Type: Physical Therapist       Filed: 10/14/21 0938  Date of Service: 10/14/21 0934  Status: Signed          Editor: Adrian Clay, PT (Physical Therapist)               PHYSICAL THERAPY EVALUATION/DISCHARGE   Patient: Adrian Saunders (66 y.o. male)   Date: 10/14/2021   Primary Diagnosis: Vomiting [R11.10]   Abdominal pain [R10.9]         Precautions: Falls              ASSESSMENT   Based on the objective data described below, the patient presents with no acute deficits that require PT intervention at this time.  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  The steady pain requires him to use an AD which he is reluctant to compliantly use.  He reports his home is setup to furniture cruise and he prefers not to use AD when out for appearance sake.  He was observed to be a bit off balance in  the room upon entry without an AD.  He has been strongly encouraged to use an AD to prevent fall and was given one to use during his stay here and he verbally agreed to use the AD.  As long as he uses the AD he has not PT needs but would likely benefit  from outpatient ortho follow-up for the knees.      Other factors to consider for discharge: lives alone       Further skilled acute physical therapy is not indicated at this time.          PLAN :   Recommendation for discharge: (in order for the patient to meet his/her long term  goals)   No skilled physical therapy/ follow up rehabilitation needs identified at this time.      This discharge recommendation:   Has been made in collaboration with the attending provider and/or case management      IF patient discharges home will need the following DME: patient owns DME required for discharge             SUBJECTIVE:     Patient stated My knees bother me all the time.        OBJECTIVE  DATA SUMMARY:     HISTORY:       Past Medical History:        Diagnosis  Date         ?  CAD (coronary artery disease)            hx of MI         ?  Chronic GERD       ?  Diabetes (HCC)       ?  Erectile dysfunction       ?  Gout       ?  Hypercholesteremia       ?  Hypertension           ?  Osteoarthritis       No past surgical history on file.      Prior level of function: independent but non-compliant with AD   Personal factors and/or comorbidities impacting plan  of care: impulsive and will not use AD      Home Situation   Home Environment: Private residence   # Steps to Enter: 8   Rails to Enter: Yes   Hand Rails : Bilateral   Wheelchair Ramp: No   One/Two Story Residence: One story   Living Alone: Yes   Support Systems: Other Family Member(s)   Patient Expects to be Discharged to:: Home   Current DME Used/Available at Home: Environmental consultant, rolling, Medical laboratory scientific officer, quad      EXAMINATION/PRESENTATION/DECISION MAKING:    Critical Behavior:   Neurologic State: Alert   Orientation Level: Oriented X4           Hearing:   Auditory   Auditory Impairment: None   Range Of Motion:                                   Strength:                                 Tone & Sensation:                                             Coordination:       Vision:        Functional Mobility:   Bed Mobility:   Rolling: Independent   Supine to Sit: Independent   Sit to Supine: Independent   Scooting: Independent   Transfers:   Sit to Stand: Modified independent   Stand to Sit: Modified independent   Stand Pivot Transfers: Modified independent      Bed to Chair: Modified independent                  Balance:    Sitting: Intact   Standing: Intact   Ambulation/Gait Training:   Distance (ft): 200 Feet (ft)   Assistive Device: Cane, straight   Ambulation - Level of Assistance: Supervision       Gait Description (WDL): Exceptions to WDL   Gait Abnormalities: Antalgic           Base of Support: Widened       Speed/Cadence: Slow                                Stairs:    Number of Stairs Trained: 8 (simulated in room)   Stairs - Level of Assistance: Modified independent    Rail Use: Left       Treatment Session:   Pt provided verbal cues on proper sequencing when using an AD to negotiate stairs            Physical Therapy Evaluation Charge Determination          History  Examination  Presentation  Decision-Making          LOW Complexity : Zero comorbidities / personal factors that will impact the outcome / POC  LOW Complexity : 1-2 Standardized tests and measures addressing body structure, function, activity limitation and / or participation in recreation   LOW Complexity : Stable, uncomplicated   Low         Based on the above components, the patient evaluation is determined to be  of the following complexity level: LOW       Pain Rating:   8/10 bilateral knees      Activity Tolerance:    Good         After treatment patient left in no apparent distress:    Supine in bed and Call bell within reach        COMMUNICATION/EDUCATION:     The patients plan of care was discussed with: Physical therapist, Physician, and Case management.       Fall prevention education was provided and the patient/caregiver indicated understanding.      Thank you for this referral.   Adrian Saunders, PT, DPT    Time Calculation: 25 mins

## 2021-10-14 NOTE — Progress Notes (Signed)
No complaints during the shift.

## 2021-10-21 ENCOUNTER — Encounter: Admit: 2021-10-21 | Discharge: 2021-10-21 | Payer: MEDICARE | Attending: Orthopaedic Surgery | Primary: Family Medicine

## 2021-10-21 ENCOUNTER — Ambulatory Visit: Admit: 2021-10-21 | Payer: MEDICARE | Primary: Family Medicine

## 2021-10-21 ENCOUNTER — Encounter

## 2021-10-21 DIAGNOSIS — M25562 Pain in left knee: Secondary | ICD-10-CM

## 2021-10-21 MED ORDER — DICLOFENAC SODIUM 1 % EX GEL
1 % | Freq: Two times a day (BID) | CUTANEOUS | 8 refills | Status: AC
Start: 2021-10-21 — End: 2021-12-23

## 2021-10-21 NOTE — Patient Instructions (Signed)
Knee Pain or Injury: Care Instructions  Overview     Injuries are a common cause of knee problems. Sudden (acute) injuries may be caused by a direct blow to the knee. They can also be caused by abnormal twisting, bending, or falling on the knee. Pain, bruising, or swelling may be severe, and may start within minutes of the injury.  Overuse is another cause of knee pain. Other causes are climbing stairs, kneeling, and other activities that use the knee. Everyday wear and tear, especially as you get older, also can cause knee pain.  Rest, along with home treatment, often relieves pain and allows your knee to heal. If you have a serious knee injury, you may need tests and treatment.  Follow-up care is a key part of your treatment and safety. Be sure to make and go to all appointments, and call your doctor if you are having problems. It's also a good idea to know your test results and keep a list of the medicines you take.  How can you care for yourself at home?  Be safe with medicines. Read and follow all instructions on the label.  If the doctor gave you a prescription medicine for pain, take it as prescribed.  If you are not taking a prescription pain medicine, ask your doctor if you can take an over-the-counter medicine.  Rest and protect your knee. Take a break from any activity that may cause pain.  Put ice or a cold pack on your knee for 10 to 20 minutes at a time. Put a thin cloth between the ice and your skin.  Prop up a sore knee on a pillow when you ice it or anytime you sit or lie down for the next 3 days. Try to keep it above the level of your heart. This will help reduce swelling.  If your knee is not swollen, you can put moist heat, a heating pad, or a warm cloth on your knee.  If your doctor recommends an elastic bandage, sleeve, or other type of support for your knee, wear it as directed.  Follow your doctor's instructions about how much weight you can put on your leg. Use a cane, crutches, or a walker  as instructed.  Follow your doctor's instructions about activity during your healing process. If you can do mild exercise, slowly increase your activity.  Stay at a healthy weight. Extra weight can strain the joints, especially the knees and hips, and make the pain worse. Losing a few pounds may help.  When should you call for help?   Call 911 anytime you think you may need emergency care. For example, call if:    You have symptoms of a blood clot in your lung (called a pulmonary embolism). These may include:  Sudden chest pain.  Trouble breathing.  Coughing up blood.   Call your doctor now or seek immediate medical care if:    You have severe or increasing pain.     Your leg or foot turns cold or changes color.     You cannot stand or put weight on your knee.     Your knee looks twisted or bent out of shape.     You cannot move your knee.     You have signs of infection, such as:  Increased pain, swelling, warmth, or redness.  Red streaks leading from the knee.  Pus draining from a place on your knee.  A fever.     You have signs of   a blood clot in your leg (called a deep vein thrombosis), such as:  Pain in your calf, back of the knee, thigh, or groin.  Redness and swelling in your leg or groin.   Watch closely for changes in your health, and be sure to contact your doctor if:    You have tingling, weakness, or numbness in your knee.     You have any new symptoms, such as swelling.     You have bruises from a knee injury that last longer than 2 weeks.     You do not get better as expected.   Where can you learn more?  Go to https://www.healthwise.net/patientEd and enter K195 to learn more about "Knee Pain or Injury: Care Instructions."  Current as of: October 01, 2020               Content Version: 13.5  ?? 2006-2022 [employer].   Care instructions adapted under license by [facility]. If you have questions about a medical condition or this instruction, always ask your healthcare professional.  [employer] Inc[employer] disclaims any warranty or liability for your use of this information.

## 2021-10-21 NOTE — Progress Notes (Unsigned)
Name: Adrian Saunders    DOB: 01-26-1956     [facility]  [place] [facility]  [address], [address]  [place] [address]  Dept: [phone]  Dept Fax: [phone]     Chief Complaint   Patient presents with    Knee Pain        There were no vitals taken for this visit.     No Known Allergies     Current Outpatient Medications   Medication Sig Dispense Refill    allopurinol (ZYLOPRIM) 300 MG tablet TAKE 1 AND 1/2 TABLETS BY MOUTH EVERY DAY      amLODIPine (NORVASC) 10 MG tablet Take 10 mg by mouth daily      dorzolamide-timolol (COSOPT) 22.3-6.8 MG/ML ophthalmic solution INSTILL 1 DROP INTO BOTH EYES TWICE A DAY      glipiZIDE (GLUCOTROL XL) 2.5 MG extended release tablet Take 2.5 mg by mouth daily      hydrALAZINE (APRESOLINE) 25 MG tablet Take 25 mg by mouth 3 times daily      losartan (COZAAR) 50 MG tablet Take 50 mg by mouth daily      metFORMIN (GLUCOPHAGE-XR) 500 MG extended release tablet       metoprolol succinate (TOPROL XL) 25 MG extended release tablet Take 25 mg by mouth daily      oxyCODONE-acetaminophen (PERCOCET) 10-325 MG per tablet TAKE ONE TABLET BY MOUTH EVERY 6 HOURS AS NEEDED      pantoprazole (PROTONIX) 40 MG tablet Take 40 mg by mouth daily as needed      GAVILYTE-G 236 g solution MIX AND DRINK AS INSTRUCTED IN PRE-OPERATIVE INSTRUCTIONS      pravastatin (PRAVACHOL) 20 MG tablet Take 20 mg by mouth daily      sildenafil (VIAGRA) 100 MG tablet Take 100 mg by mouth daily as needed      diclofenac sodium (VOLTAREN) 1 % GEL Apply 2 g topically 2 times daily Apply 2 grams to affected area 4x a day 200 g 8     No current facility-administered medications for this visit.      Patient Active Problem List   Diagnosis    Vomiting    Abdominal pain    Hypokalemia    Elevated troponin    DM (diabetes mellitus) (HCC)    HTN (hypertension)      History reviewed. No pertinent family history.   Social History     Socioeconomic History     Marital status: Single     Spouse name: None    Number of children: None    Years of education: None    Highest education level: None   Tobacco Use    Smoking status: Never    Smokeless tobacco: Never   Substance and Sexual Activity    Alcohol use: Yes      History reviewed. No pertinent surgical history.   Past Medical History:   Diagnosis Date    Diabetes mellitus (HCC)     Heart disease     Hypertension         I have reviewed and agree with PFSH and ROS and intake form in chart and the record furthermore I have reviewed prior medical record(s) regarding this patients care during this appointment.     Review of Systems:   Patient is a pleasant appearing individual, appropriately dressed, well hydrated, well nourished, who is alert, appropriately oriented for age, and in no acute distress  with a normal gait and normal affect who does not appear to be in any significant pain.    Physical Exam:  Left knee - Neurovascularly intact with good cap refill, full range of motion and full strength, well healed incision noted, no swelling, no erythema, no instability.     Right knee - Neurovascularly intact with good cap refill, full range of motion and full strength, well healed incision noted, no swelling, no erythema, no instability.    Visit Diagnoses         Codes    Pain in both knees, unspecified chronicity    -  Primary M25.561, M25.562               HPI:  The patient is here with a chief complaint of bilateral knee pain, throbbing, burning pain, status post bilateral total knee replacement, doing well.     X-rays of bilateral knees shows well-placed prosthesis except in the left knee in the sunrise view he does have a little bit of tear of the patella.    Assessment/Plan:  1.  Bilateral knee pain that is not resolved.    Plan at this point, I do not recommend range of motion, activities as tolerated started, weightbearing started, no restrictions.  We will see the patient back as needed and we will do Voltaren gel  and go from there.    As part of continued conservative pain management options the patient was advised to utilize Tylenol or OTC NSAIDS as long as it is not medically contraindicated.     Return to Office:    Follow-up and Dispositions    Return if symptoms worsen or fail to improve.           Scribed by Diona Foley, LPN as dictated by [facility] A. Allena Katz, MD.  Documentation, performed by, True and Accepted Manish A. Allena Katz, MD

## 2021-11-08 NOTE — Telephone Encounter (Signed)
Followed up patient's recent hospital stay from 3/21 and discharged on 3/22.    Patient presented with abdominal pain and vomiting.  During that timeframe it was noted that patient was continue to use BC powders and Aleve and was counseled on minimizing the use of these medications.  Patient says since being discharged he has done well no further episodes of abdominal pain or nausea vomiting.  Does have follow-up with Dr. Gwinda Passe on 5/24 and he says that [facility]  We also made him an appointment with Dr. Allena Katz to follow-up for his severe arthritis.  Patient has no questions or concerns at this time and encouraged him to continue to follow-up with his primary provider as well as appointments with GI, orthopedics, cardiology

## 2021-12-15 ENCOUNTER — Encounter

## 2021-12-15 ENCOUNTER — Inpatient Hospital Stay: Admit: 2021-12-15 | Payer: MEDICARE | Primary: Family Medicine

## 2021-12-15 DIAGNOSIS — I25119 Atherosclerotic heart disease of native coronary artery with unspecified angina pectoris: Secondary | ICD-10-CM

## 2021-12-15 LAB — EKG 12-LEAD
Atrial Rate: 91 {beats}/min
P Axis: 57 degrees
P-R Interval: 157 ms
Q-T Interval: 344 ms
QRS Duration: 89 ms
QTc Calculation (Bazett): 424 ms
R Axis: 33 degrees
T Axis: -11 degrees
Ventricular Rate: 91 {beats}/min

## 2021-12-15 LAB — CBC WITH AUTO DIFFERENTIAL
Absolute Immature Granulocyte: 0 10*3/uL (ref 0.00–0.04)
Basophils %: 1 % (ref 0–1)
Basophils Absolute: 0.1 10*3/uL (ref 0.0–0.1)
Eosinophils %: 2 % (ref 0–7)
Eosinophils Absolute: 0.1 10*3/uL (ref 0.0–0.4)
Hematocrit: 42.4 % (ref 36.6–50.3)
Hemoglobin: 14 g/dL (ref 12.1–17.0)
Immature Granulocytes: 0 % (ref 0–0.5)
Lymphocytes %: 25 % (ref 12–49)
Lymphocytes Absolute: 2.2 10*3/uL (ref 0.8–3.5)
MCH: 28.8 PG (ref 26.0–34.0)
MCHC: 33 g/dL (ref 30.0–36.5)
MCV: 87.2 FL (ref 80.0–99.0)
MPV: 9.5 FL (ref 8.9–12.9)
Monocytes %: 9 % (ref 5–13)
Monocytes Absolute: 0.8 10*3/uL (ref 0.0–1.0)
Neutrophils %: 63 % (ref 32–75)
Neutrophils Absolute: 5.6 10*3/uL (ref 1.8–8.0)
Nucleated RBCs: 0 PER 100 WBC
Platelets: 230 10*3/uL (ref 150–400)
RBC: 4.86 M/uL (ref 4.10–5.70)
RDW: 14 % (ref 11.5–14.5)
WBC: 8.7 10*3/uL (ref 4.1–11.1)
nRBC: 0 10*3/uL (ref 0.00–0.01)

## 2021-12-15 LAB — COMPREHENSIVE METABOLIC PANEL
ALT: 29 U/L (ref 12–78)
AST: 16 U/L (ref 15–37)
Albumin/Globulin Ratio: 0.9 — ABNORMAL LOW (ref 1.1–2.2)
Albumin: 3.8 g/dL (ref 3.5–5.0)
Alk Phosphatase: 50 U/L (ref 45–117)
Anion Gap: 7 mmol/L (ref 5–15)
BUN: 10 mg/dL (ref 6–20)
Bun/Cre Ratio: 8 — ABNORMAL LOW (ref 12–20)
CO2: 29 mmol/L (ref 21–32)
Calcium: 9.6 mg/dL (ref 8.5–10.1)
Chloride: 102 mmol/L (ref 97–108)
Creatinine: 1.19 mg/dL (ref 0.70–1.30)
Est, Glom Filt Rate: >60 mL/min/{1.73_m2} (ref >60)
Globulin: 4.1 g/dL — ABNORMAL HIGH (ref 2.0–4.0)
Glucose: 203 mg/dL — ABNORMAL HIGH (ref 65–100)
Potassium: 3.8 mmol/L (ref 3.5–5.1)
Sodium: 138 mmol/L (ref 136–145)
Total Bilirubin: 0.3 mg/dL (ref 0.2–1.0)
Total Protein: 7.9 g/dL (ref 6.4–8.2)

## 2021-12-15 LAB — PROTIME-INR
INR: 1.1 (ref 0.9–1.1)
Protime: 13.4 s (ref 11.9–14.6)

## 2021-12-16 ENCOUNTER — Encounter: Attending: Gastroenterology | Primary: Student in an Organized Health Care Education/Training Program

## 2021-12-16 ENCOUNTER — Ambulatory Visit: Admit: 2021-12-16 | Discharge: 2021-12-16 | Payer: MEDICARE | Attending: Gastroenterology | Primary: Family Medicine

## 2021-12-16 DIAGNOSIS — K219 Gastro-esophageal reflux disease without esophagitis: Secondary | ICD-10-CM

## 2021-12-16 NOTE — Progress Notes (Signed)
1. Have you been to the ER, urgent care clinic since your last visit?  Hospitalized since your last visit? Yes   last month. Stomach pain    2. Have you seen or consulted any other health care providers outside of the [facility] since your last visit?  Include any pap smears or colon screening. No   Chief Complaint   Patient presents with    New Patient     gastritis     BP (!) 170/90 (Site: Left Upper Arm, Position: Sitting, Cuff Size: Medium Adult)   Pulse 80   Temp 97.3 F (36.3 C) (Temporal)   Resp 14   Ht 5\' 6"  (1.676 m)   Wt 248 lb 3.2 oz (112.6 kg)   SpO2 96%   BMI 40.06 kg/m

## 2021-12-17 NOTE — Other (Addendum)
Patient stated that he was instructed to hold his Metformin 48 hours prior to procedure. Patient also instructed to hold his Viagra at least 48 hours prior to procedure, verbalized understanding.

## 2021-12-22 ENCOUNTER — Inpatient Hospital Stay: Payer: Medicare Other | Attending: Student in an Organized Health Care Education/Training Program

## 2021-12-22 LAB — POCT GLUCOSE
POC Glucose: 144 mg/dL — ABNORMAL HIGH (ref 65–100)
POC Glucose: 118 mg/dL — ABNORMAL HIGH (ref 65–100)

## 2021-12-22 LAB — APTT: PTT: 32.9 s (ref 21.2–34.1)

## 2021-12-22 LAB — CARDIAC PROCEDURE: Body Surface Area: 2.32 m2

## 2021-12-22 MED ORDER — IOPAMIDOL 76 % IV SOLN
76 % | INTRAVENOUS | Status: DC | PRN
Start: 2021-12-22 — End: 2021-12-22
  Administered 2021-12-22: 14:00:00 50 via INTRAVENOUS

## 2021-12-22 MED ORDER — HEPARIN (PORCINE) IN NACL 1000-0.9 UT/500ML-% IV SOLN
INTRAVENOUS | Status: AC | PRN
Start: 2021-12-22 — End: 2021-12-22
  Administered 2021-12-22 (×3): 500

## 2021-12-22 MED ORDER — MIDAZOLAM HCL 2 MG/2ML IJ SOLN
2 MG/ML | INTRAMUSCULAR | Status: AC
Start: 2021-12-22 — End: ?

## 2021-12-22 MED ORDER — NITROGLYCERIN 5 MG/ML IV SOLN
5 MG/ML | INTRAVENOUS | Status: AC
Start: 2021-12-22 — End: ?

## 2021-12-22 MED ORDER — LIDOCAINE HCL 1 % IJ SOLN
1 % | INTRAMUSCULAR | Status: AC
Start: 2021-12-22 — End: ?

## 2021-12-22 MED ORDER — HEPARIN SODIUM (PORCINE) 1000 UNIT/ML IJ SOLN
1000 UNIT/ML | INTRAMUSCULAR | Status: DC | PRN
Start: 2021-12-22 — End: 2021-12-22
  Administered 2021-12-22: 14:00:00 5000 via INTRAVENOUS

## 2021-12-22 MED ORDER — VERAPAMIL HCL 2.5 MG/ML IV SOLN
2.5 MG/ML | INTRAVENOUS | Status: AC
Start: 2021-12-22 — End: ?

## 2021-12-22 MED ORDER — SODIUM CHLORIDE 0.9 % IV SOLN
0.9 % | INTRAVENOUS | Status: DC
Start: 2021-12-22 — End: 2021-12-22

## 2021-12-22 MED ORDER — NITROGLYCERIN IV BOLUS
200-5 MCG/ML-% | INTRAVENOUS | Status: DC | PRN
Start: 2021-12-22 — End: 2021-12-22
  Administered 2021-12-22: 14:00:00 200 via INTRAVENOUS

## 2021-12-22 MED ORDER — MIDAZOLAM HCL 2 MG/2ML IJ SOLN
2 MG/ML | INTRAMUSCULAR | Status: DC | PRN
Start: 2021-12-22 — End: 2021-12-22
  Administered 2021-12-22: 13:00:00 1 via INTRAVENOUS

## 2021-12-22 MED ORDER — NORMAL SALINE FLUSH 0.9 % IV SOLN
0.9 % | INTRAVENOUS | Status: DC | PRN
Start: 2021-12-22 — End: 2021-12-22

## 2021-12-22 MED ORDER — SODIUM CHLORIDE 0.9 % IV SOLN
0.9 % | Freq: Once | INTRAVENOUS | Status: DC
Start: 2021-12-22 — End: 2021-12-22

## 2021-12-22 MED ORDER — NORMAL SALINE FLUSH 0.9 % IV SOLN
0.9 % | Freq: Two times a day (BID) | INTRAVENOUS | Status: DC
Start: 2021-12-22 — End: 2021-12-22

## 2021-12-22 MED ORDER — VERAPAMIL HCL 2.5 MG/ML IV SOLN
2.5 MG/ML | INTRAVENOUS | Status: DC | PRN
Start: 2021-12-22 — End: 2021-12-22
  Administered 2021-12-22: 14:00:00 2.5 via INTRA_ARTERIAL

## 2021-12-22 MED ORDER — LIDOCAINE HCL 1 % IJ SOLN
1 % | INTRAMUSCULAR | Status: DC | PRN
Start: 2021-12-22 — End: 2021-12-22
  Administered 2021-12-22: 14:00:00 1 via INTRADERMAL

## 2021-12-22 MED ORDER — ACETAMINOPHEN 325 MG PO TABS
325 MG | ORAL | Status: DC | PRN
Start: 2021-12-22 — End: 2021-12-22

## 2021-12-22 MED ORDER — HEPARIN SODIUM (PORCINE) 1000 UNIT/ML IJ SOLN
1000 UNIT/ML | INTRAMUSCULAR | Status: AC
Start: 2021-12-22 — End: ?

## 2021-12-22 MED ORDER — FENTANYL CITRATE (PF) 100 MCG/2ML IJ SOLN
100 MCG/2ML | INTRAMUSCULAR | Status: AC
Start: 2021-12-22 — End: ?

## 2021-12-22 MED ORDER — FENTANYL CITRATE PF 50 MCG/ML IJ SOSY
50 MCG/ML | INTRAMUSCULAR | Status: DC | PRN
Start: 2021-12-22 — End: 2021-12-22
  Administered 2021-12-22: 13:00:00 50 via INTRAVENOUS

## 2021-12-22 MED ORDER — SODIUM CHLORIDE 0.9 % IV SOLN
0.9 % | INTRAVENOUS | Status: DC | PRN
Start: 2021-12-22 — End: 2021-12-22

## 2021-12-22 MED ORDER — HEPARIN (PORCINE) IN NACL 1000-0.9 UT/500ML-% IV SOLN
INTRAVENOUS | Status: AC
Start: 2021-12-22 — End: ?

## 2021-12-22 MED FILL — HEPARIN (PORCINE) IN NACL 1000-0.9 UT/500ML-% IV SOLN: INTRAVENOUS | Qty: 1500

## 2021-12-22 MED FILL — SODIUM CHLORIDE 0.9 % IV SOLN: 0.9 % | INTRAVENOUS | Qty: 1000

## 2021-12-22 MED FILL — VERAPAMIL HCL 2.5 MG/ML IV SOLN: 2.5 MG/ML | INTRAVENOUS | Qty: 2

## 2021-12-22 MED FILL — HEPARIN SODIUM (PORCINE) 1000 UNIT/ML IJ SOLN: 1000 UNIT/ML | INTRAMUSCULAR | Qty: 10

## 2021-12-22 MED FILL — NITROGLYCERIN 5 MG/ML IV SOLN: 5 MG/ML | INTRAVENOUS | Qty: 10

## 2021-12-22 MED FILL — FENTANYL CITRATE (PF) 100 MCG/2ML IJ SOLN: 100 MCG/2ML | INTRAMUSCULAR | Qty: 2

## 2021-12-22 MED FILL — SODIUM CHLORIDE FLUSH 0.9 % IV SOLN: 0.9 % | INTRAVENOUS | Qty: 40

## 2021-12-22 MED FILL — MIDAZOLAM HCL 2 MG/2ML IJ SOLN: 2 MG/ML | INTRAMUSCULAR | Qty: 2

## 2021-12-22 MED FILL — LIDOCAINE HCL 1 % IJ SOLN: 1 % | INTRAMUSCULAR | Qty: 10

## 2021-12-22 NOTE — Progress Notes (Signed)
Pt. Transferred to [facility] via stretcher in stable condition.  Bedside report given, SBAR reviewed, sites viewed.  Belongings to bedside. Pt. Reports brother is in waiting room and will let nurse know when ready for him to come back prior to discharge.

## 2021-12-22 NOTE — Other (Signed)
Patient alert and oriented x4, VS stable, no complaints of pain at this time. Brother at bedside. Discharge instructions/education provided to brother, Kemani Waggy, he verbalized understanding and had no questions. Patient was discharged in wheelchair to main entrance of hospital with family to home via private vehicle.

## 2021-12-22 NOTE — Discharge Instructions (Signed)
[facility]  Cardiac Catheterization Department  [address] [place] [address]  [phone]        Coronary Angiogram: What to Expect at Home  Your Recovery  A coronary angiogram is a test to examine the large blood vessels of your heart (coronary arteries).  The doctor inserted a thin, flexible tube (catheter into a blood vessel in your groin or wrist.  Your groin or wrist may have a bruise and feel sore for a few days after the procedure.  You can do light activities around the house.  But do not do anything strenuous until your doctor says it is ok.  This may be for several days.  This care sheet gives you a general idea about how long it will take for you to recover.  But each person recovers at a different pace.  Follow the steps below to feel better as quickly as possible.    How can you care for yourself at home?    Activity  If the doctor gave you a sedative:  For 24 hours, don't do anything that requires attention to detail, such as going to work, making important decisions, or signing any legal documents.  It takes time for the medicine's effects to completely wear off.  For your safety, do not drive or operate any machinery that could be dangerous.  Wait until the medicine wears off and you can think clearly and react easily.  Do not do any strenuous exercise and do not lift, pull, or push anything heavier than 5 pounds (approximately the weight of 1 gallon of milk) until your doctor says it is ok.  This may be for several days.  You can walk around the house and do light activity, such as cooking.  If the catheter was placed in your groin and you must use stairs, just go up and down slowly in as few trips as possible for the first couple of days.  If the catheter was placed in your wrist, do not bend your wrist deeply for the first couple of days.  A soft wrist-splint may be placed on your wrist as a reminder following the procedure.  Be careful using your hand to get  into and out of a chair or bed and when opening doors.   Get regular exercise, after being cleared by your cardiologist.  Walking may be a good choice.  Try for at least 30 minutes on most days of the week.  If you smoke or use smokeless tobacco products, including vaping products, it is extremely important that you quit using these products.  Please ask you nurse or doctor for more information about smoking cessation.    Diet  Drink plenty of fluids to help you body flush out the dye.  If you have kidney, heart, or liver disease and have to limit fluids, talk to your doctor before increasing the amount of fluids you drink.  Keep eating a heart-healthy diet that has lots of fruits, vegetables, and whole grains.  If you have not been eating this way, talk to your doctor.  You also may want to talk to a dietician.  This expert can help you to learn about healthy foods and plan meals.    Medicines  Your doctor will tell you if and when you can restart your medicines.  You will also be given instructions about taking any new medicines.  Take these medicines as prescribed.  Continue taking your cholesterol lowering medications ("  statins"), if you have been prescribed this.  You doctor may prescribe a blood-thinning medicine like aspirin or clopidogrel (Plavix), plasugrel (Effient), or ticagrelor (Brilinta).  If you had angioplasty or a stent placement, you must continue aspirin and Plavix, Effient, or Brilinta.  It is very important that you take these medicines exactly as directed to keep the coronary artery open and reduce your risk of heart attack.  Be safe with medicines.  Call your doctor if you think you are having a problem with taking or obtaining your medicines, notify your doctor immediately.  You cannot afford to miss your medications.  Do not stop taking these medications without speaking to your cardiologist first.   Do not strain to have a bowel movement.  Ask your doctor if you feel you may need a stool  softener of laxative.    Care of the catheter site  For 1 or 2 days, keep a bandage over the spot where the catheter(s) was inserted.  The bandage probably will fall off in this time.  Put ice or a cold pack on the area for 10 to 20 minutes at a time to help with soreness of swelling.  Place a thin cloth between the ice and your skin.  You may shower 24 to 48 hours after the procedure, if your doctor says it is okay.  Pat the incision dry with a clean towel afterwards.  Do not soak the catheter site until it is healed.  Don't take a bath for 1 week, or until your doctor tells you it is okay to do so.  The use of lotions and powders is not recommended at the site until it is completely healed.  Watch for bleeding from the site.  A small amount of blood (up to the size of a quarter) on the bandage can be normal.  If you cough, sneeze, or laugh vigorously, apply direct pressure with your hand over the catheter site.  If you notice a little bit of blood from the catheter site (similar to a paper cut or shaving nick), lie down and press firmly on the area for 15 minutes to try and make it stop bleeding.  If the bleeding does not stop, call your doctor or seek immediate medical care.  If you notice heavy bleeding at the site that has either saturated the bandage or is squirting, lie down, press firmly on the site, and have someone call 911.    Follow-up care is a key part of your treatment and safety.  Be sure to make and go to all appointments and call your doctor if you are having problems.  It's also a good idea to know your test results and keep a list of medicines you take.    When should you call for help?    Call 911 anytime you think you may need emergency care.    For example, call if:  You passed out (lost consciousness).  You have severe trouble breathing.  You have sudden chest pain and shortness of breath, or you cough up blood.  Call 911 if you have symptoms of a heart attack, such as:  Chest Pain, pressure,  squeezing, or burning.  Sweating.  Shortness of breath or difficulty breathing.  Nausea or vomiting.  Jaw pain, shoulder pain, or arm pain in one or both sides.  Feelings of fullness.  Excessive fatigue or weakness.  New onset anxiety.  New onset of upper back pain.  Dizziness or lightheadedness.  A fast   or uneven pulse.  Call 911 if you have been diagnosed with angina, and you have symptoms that do not go away with rest or are not getting better within 5 minutes of taking a dose of your nitroglycerin.  After you call 911, the operator may tell you to chew 1 adult-strength (325 mg) of 2 to 4 low-dose aspirin (81 mg).  Wait for ambulance.  Do not drive yourself.    Call your doctor now or seek immediate medical care if:  You are bleeding from the area where the catheter was inserted.  You have a fast-growing, painful lump at the catheter site.  You have signs of infection, such as"  Increased pain, swelling, warmth or redness at the catheter site.  Red streaks leading from the catheter site.  Pus draining from the catheter site.  A fever.  Your leg or hand is painful, looks blue, or feels cold, numb, or tingly.     Watch closely for changes in your health and be sure to contact your doctor if you have any problems.

## 2021-12-22 NOTE — Other (Signed)
Patient alert and oriented x4, VS stable, no complaints of pain at this time. No one at bedside. Bed in low position, call bell within reach.    Patient states okay to review and give discharge instructions to Ray Church, Brother.

## 2021-12-23 NOTE — Progress Notes (Signed)
Adrian Saunders is a 66 y.o. male who presents today for the following:  Chief Complaint   Patient presents with    New Patient     gastritis    Gas    Gastroesophageal Reflux    Nausea & Vomiting         No Known Allergies    Current Outpatient Medications   Medication Sig Dispense Refill    allopurinol (ZYLOPRIM) 300 MG tablet Take 1 tablet by mouth daily      amLODIPine (NORVASC) 10 MG tablet Take 1 tablet by mouth daily      dorzolamide-timolol (COSOPT) 22.3-6.8 MG/ML ophthalmic solution Place 1 drop into both eyes 2 times daily      losartan (COZAAR) 50 MG tablet Take 1 tablet by mouth daily      metFORMIN (GLUCOPHAGE-XR) 500 MG extended release tablet Take 2 tablets by mouth 2 times daily      metoprolol succinate (TOPROL XL) 25 MG extended release tablet Take 2 tablets by mouth daily      oxyCODONE-acetaminophen (PERCOCET) 10-325 MG per tablet Take 1 tablet by mouth every 6 hours as needed.      pantoprazole (PROTONIX) 40 MG tablet Take 1 tablet by mouth daily      pravastatin (PRAVACHOL) 20 MG tablet Take 2 tablets by mouth daily      gabapentin (NEURONTIN) 300 MG capsule Take 1 capsule by mouth in the morning and at bedtime.      Cobalamin Combinations (B12 FOLATE PO) Take 1 tablet by mouth daily      BLACK CURRANT SEED OIL PO Take 1 tablet by mouth daily      glipiZIDE (GLUCOTROL XL) 2.5 MG extended release tablet Take 1 tablet by mouth daily       No current facility-administered medications for this visit.       Past Medical History:   Diagnosis Date    Abdominal pain     CAD (coronary artery disease)     hx of MI    Cerebral artery occlusion with cerebral infarction (HCC)     Chronic GERD     Diabetes (HCC)     Diabetes mellitus (HCC)     Erectile dysfunction     Gout     Heart disease     History of heart attack 2018    Hypercholesteremia     Hypertension     Hypertension     Osteoarthritis     Seizures (HCC)     Patient stated as a child last seizure was around age 63    Sleep apnea     Patient  stated does not use a CPAP    Vomiting 10/13/2021    ER visit       Past Surgical History:   Procedure Laterality Date    CARDIAC CATHETERIZATION      CARDIAC PROCEDURE N/A 12/22/2021    Left heart cath / coronary angiography performed by Alona Bene, MD at [facility] CARDIAC CATH LAB    COLONOSCOPY      EYE SURGERY Right     had eye injury    ORTHOPEDIC SURGERY Right 2021    R knee replacement    ORTHOPEDIC SURGERY Left 2016    [facility] [place]   L Knee replacement       Family History   Problem Relation Age of Onset    Hypertension Mother     Cancer Sister  Social History     Socioeconomic History    Marital status: Single     Spouse name: Not on file    Number of children: Not on file    Years of education: Not on file    Highest education level: Not on file   Occupational History    Not on file   Tobacco Use    Smoking status: Never    Smokeless tobacco: Never   Vaping Use    Vaping Use: Never used   Substance and Sexual Activity    Alcohol use: Not Currently    Drug use: Never    Sexual activity: Not on file   Other Topics Concern    Not on file   Social History Narrative    Not on file     Social Determinants of Health     Financial Resource Strain: Not on file   Food Insecurity: Not on file   Transportation Needs: Not on file   Physical Activity: Not on file   Stress: Not on file   Social Connections: Not on file   Intimate Partner Violence: Not on file   Housing Stability: Not on file         65-year-old male with history of hypertension and diabetes mellitus comes in for evaluation of GERD and nausea vomiting.  Patient states he has a problem with GERD.  May have nausea and vomiting multiple times to get relief.  This problem has been present for years.  He may have to take baking soda for relief.  He has taken other medicines without complete relief.  May be awakened from his sleep.  Bowel movements 2 to 3/day but recently have been daily.  He has been gaining weight.  He has a history of diabetes mellitus for  4 to 5 years or more.  He has back and neck pain.  Patient is to have a cardiac catheterization next week at [facility] by [facility].        Gastroesophageal Reflux  He complains of nausea. He reports no abdominal pain.   Nausea & Vomiting  Associated symptoms include arthralgias and nausea. Pertinent negatives include no abdominal pain or vomiting.       Review of Systems   Constitutional: Negative.    HENT:  Negative for nosebleeds.    Respiratory: Negative.     Cardiovascular: Negative.    Gastrointestinal:  Positive for nausea. Negative for abdominal distention, abdominal pain, anal bleeding, blood in stool, constipation, diarrhea, rectal pain and vomiting.   Genitourinary: Negative.    Musculoskeletal:  Positive for arthralgias.   Skin: Negative.    Allergic/Immunologic: Negative.    Neurological: Negative.    Hematological: Negative.    Psychiatric/Behavioral: Negative.     All other systems reviewed and are negative.        BP (!) 170/90 (Site: Left Upper Arm, Position: Sitting, Cuff Size: Medium Adult)   Pulse 80   Temp 97.3 F (36.3 C) (Temporal)   Resp 14   Ht 5' 6" (1.676 m)   Wt 248 lb 3.2 oz (112.6 kg)   SpO2 96%   BMI 40.06 kg/m     Physical Exam  Vitals and nursing note reviewed.   Constitutional:       Appearance: Normal appearance. He is obese.   HENT:      Head: Normocephalic and atraumatic.   Eyes:      General: No scleral icterus.  Cardiovascular:        Rate and Rhythm: Normal rate and regular rhythm.      Pulses: Normal pulses.      Heart sounds: Normal heart sounds.   Pulmonary:      Effort: Pulmonary effort is normal.      Breath sounds: Normal breath sounds.   Abdominal:      General: There is no distension.      Palpations: There is no mass.      Tenderness: There is no abdominal tenderness. There is no right CVA tenderness, left CVA tenderness, guarding or rebound.      Hernia: No hernia is present.   Musculoskeletal:      Right lower leg: No edema.      Left lower leg: No  edema.   Skin:     Coloration: Skin is not jaundiced.   Neurological:      General: No focal deficit present.      Mental Status: He is alert and oriented to person, place, and time. Mental status is at baseline.   Psychiatric:         Mood and Affect: Mood normal.         Behavior: Behavior normal.         Thought Content: Thought content normal.         Judgment: Judgment normal.          1. Chronic GERD  Patient for an EGD to further evaluate.  Continue the pantoprazole 40 mg daily  - PR ESOPHAGOGASTRODUODENOSCOPY TRANSORAL DIAGNOSTIC    2. Nausea and vomiting, unspecified vomiting type      3. Change in bowel habits  Secondary to eating habits.

## 2021-12-23 NOTE — H&P (View-Only) (Signed)
Adrian Saunders is a 66 y.o. male who presents today for the following:  Chief Complaint   Patient presents with    New Patient     gastritis    Gas    Gastroesophageal Reflux    Nausea & Vomiting         No Known Allergies    Current Outpatient Medications   Medication Sig Dispense Refill    allopurinol (ZYLOPRIM) 300 MG tablet Take 1 tablet by mouth daily      amLODIPine (NORVASC) 10 MG tablet Take 1 tablet by mouth daily      dorzolamide-timolol (COSOPT) 22.3-6.8 MG/ML ophthalmic solution Place 1 drop into both eyes 2 times daily      losartan (COZAAR) 50 MG tablet Take 1 tablet by mouth daily      metFORMIN (GLUCOPHAGE-XR) 500 MG extended release tablet Take 2 tablets by mouth 2 times daily      metoprolol succinate (TOPROL XL) 25 MG extended release tablet Take 2 tablets by mouth daily      oxyCODONE-acetaminophen (PERCOCET) 10-325 MG per tablet Take 1 tablet by mouth every 6 hours as needed.      pantoprazole (PROTONIX) 40 MG tablet Take 1 tablet by mouth daily      pravastatin (PRAVACHOL) 20 MG tablet Take 2 tablets by mouth daily      gabapentin (NEURONTIN) 300 MG capsule Take 1 capsule by mouth in the morning and at bedtime.      Cobalamin Combinations (B12 FOLATE PO) Take 1 tablet by mouth daily      BLACK CURRANT SEED OIL PO Take 1 tablet by mouth daily      glipiZIDE (GLUCOTROL XL) 2.5 MG extended release tablet Take 1 tablet by mouth daily       No current facility-administered medications for this visit.       Past Medical History:   Diagnosis Date    Abdominal pain     CAD (coronary artery disease)     hx of MI    Cerebral artery occlusion with cerebral infarction (HCC)     Chronic GERD     Diabetes (HCC)     Diabetes mellitus (HCC)     Erectile dysfunction     Gout     Heart disease     History of heart attack 2018    Hypercholesteremia     Hypertension     Hypertension     Osteoarthritis     Seizures (HCC)     Patient stated as a child last seizure was around age 63    Sleep apnea     Patient  stated does not use a CPAP    Vomiting 10/13/2021    ER visit       Past Surgical History:   Procedure Laterality Date    CARDIAC CATHETERIZATION      CARDIAC PROCEDURE N/A 12/22/2021    Left heart cath / coronary angiography performed by Alona Bene, MD at [facility] CARDIAC CATH LAB    COLONOSCOPY      EYE SURGERY Right     had eye injury    ORTHOPEDIC SURGERY Right 2021    R knee replacement    ORTHOPEDIC SURGERY Left 2016    [facility] [place]   L Knee replacement       Family History   Problem Relation Age of Onset    Hypertension Mother     Cancer Sister  Social History     Socioeconomic History    Marital status: Single     Spouse name: Not on file    Number of children: Not on file    Years of education: Not on file    Highest education level: Not on file   Occupational History    Not on file   Tobacco Use    Smoking status: Never    Smokeless tobacco: Never   Vaping Use    Vaping Use: Never used   Substance and Sexual Activity    Alcohol use: Not Currently    Drug use: Never    Sexual activity: Not on file   Other Topics Concern    Not on file   Social History Narrative    Not on file     Social Determinants of Health     Financial Resource Strain: Not on file   Food Insecurity: Not on file   Transportation Needs: Not on file   Physical Activity: Not on file   Stress: Not on file   Social Connections: Not on file   Intimate Partner Violence: Not on file   Housing Stability: Not on file         66 year old male with history of hypertension and diabetes mellitus comes in for evaluation of GERD and nausea vomiting.  Patient states he has a problem with GERD.  May have nausea and vomiting multiple times to get relief.  This problem has been present for years.  He may have to take baking soda for relief.  He has taken other medicines without complete relief.  May be awakened from his sleep.  Bowel movements 2 to 3/day but recently have been daily.  He has been gaining weight.  He has a history of diabetes mellitus for  4 to 5 years or more.  He has back and neck pain.  [facility]        Gastroesophageal Reflux  He complains of nausea. He reports no abdominal pain.   Nausea & Vomiting  Associated symptoms include arthralgias and nausea. Pertinent negatives include no abdominal pain or vomiting.       Review of Systems   Constitutional: Negative.    HENT:  Negative for nosebleeds.    Respiratory: Negative.     Cardiovascular: Negative.    Gastrointestinal:  Positive for nausea. Negative for abdominal distention, abdominal pain, anal bleeding, blood in stool, constipation, diarrhea, rectal pain and vomiting.   Genitourinary: Negative.    Musculoskeletal:  Positive for arthralgias.   Skin: Negative.    Allergic/Immunologic: Negative.    Neurological: Negative.    Hematological: Negative.    Psychiatric/Behavioral: Negative.     All other systems reviewed and are negative.        BP (!) 170/90 (Site: Left Upper Arm, Position: Sitting, Cuff Size: Medium Adult)   Pulse 80   Temp 97.3 F (36.3 C) (Temporal)   Resp 14   Ht 5\' 6"  (1.676 m)   Wt 248 lb 3.2 oz (112.6 kg)   SpO2 96%   BMI 40.06 kg/m     Physical Exam  Vitals and nursing note reviewed.   Constitutional:       Appearance: Normal appearance. He is obese.   HENT:      Head: Normocephalic and atraumatic.   Eyes:      General: No scleral icterus.  Cardiovascular:  Rate and Rhythm: Normal rate and regular rhythm.      Pulses: Normal pulses.      Heart sounds: Normal heart sounds.   Pulmonary:      Effort: Pulmonary effort is normal.      Breath sounds: Normal breath sounds.   Abdominal:      General: There is no distension.      Palpations: There is no mass.      Tenderness: There is no abdominal tenderness. There is no right CVA tenderness, left CVA tenderness, guarding or rebound.      Hernia: No hernia is present.   Musculoskeletal:      Right lower leg: No edema.      Left lower leg: No  edema.   Skin:     Coloration: Skin is not jaundiced.   Neurological:      General: No focal deficit present.      Mental Status: He is alert and oriented to person, place, and time. Mental status is at baseline.   Psychiatric:         Mood and Affect: Mood normal.         Behavior: Behavior normal.         Thought Content: Thought content normal.         Judgment: Judgment normal.          1. Chronic GERD  Patient for an EGD to further evaluate.  Continue the pantoprazole 40 mg daily  - PR ESOPHAGOGASTRODUODENOSCOPY TRANSORAL DIAGNOSTIC    2. Nausea and vomiting, unspecified vomiting type      3. Change in bowel habits  Secondary to eating habits.

## 2021-12-29 NOTE — Progress Notes (Signed)
EGD IS SCHEDULED FOR 01-01-2022 AT 10:30 AM.  NO PA REQUIRED -MEDICARE.

## 2022-01-01 ENCOUNTER — Encounter

## 2022-01-01 ENCOUNTER — Inpatient Hospital Stay: Payer: MEDICARE | Attending: Gastroenterology

## 2022-01-01 LAB — POCT GLUCOSE: POC Glucose: 126 mg/dL — ABNORMAL HIGH (ref 65–100)

## 2022-01-01 MED ORDER — PROPOFOL 200 MG/20ML IV EMUL
200 MG/20ML | INTRAVENOUS | Status: AC
Start: 2022-01-01 — End: ?

## 2022-01-01 MED ORDER — SUCRALFATE 1 G PO TABS
1 GM | ORAL_TABLET | Freq: Four times a day (QID) | ORAL | 3 refills | Status: AC
Start: 2022-01-01 — End: ?

## 2022-01-01 MED ORDER — PROPOFOL 200 MG/20ML IV EMUL
200 MG/20ML | INTRAVENOUS | Status: DC | PRN
Start: 2022-01-01 — End: 2022-01-01
  Administered 2022-01-01: 17:00:00 100 via INTRAVENOUS

## 2022-01-01 MED ORDER — SODIUM CHLORIDE 0.9 % IV SOLN
0.9 % | INTRAVENOUS | Status: DC | PRN
Start: 2022-01-01 — End: 2022-01-01

## 2022-01-01 MED FILL — PROPOFOL 200 MG/20ML IV EMUL: 200 MG/20ML | INTRAVENOUS | Qty: 20

## 2022-01-01 NOTE — Anesthesia Pre-Procedure Evaluation (Signed)
Department of Anesthesiology  Preprocedure Note       Name:  Adrian Saunders   Age:  66 y.o.  DOB:  January 27, 1956                                          MRN:  [id number]         Date:  01/01/2022      Surgeon: Juliann Mule):  Theopolis Ludger Nutting., MD    Procedure: Procedure(s):  EGD WITH BIOPSY    Medications prior to admission:   Prior to Admission medications    Medication Sig Start Date End Date Taking? Authorizing Provider   Semaglutide (RYBELSUS) 3 MG TABS Take 3 mg by mouth   Yes Historical Provider, MD   pantoprazole (PROTONIX) 40 MG injection Infuse 40 mg intravenously daily   Yes Historical Provider, MD   aspirin 81 MG chewable tablet Take 1 tablet by mouth daily   Yes Historical Provider, MD   gabapentin (NEURONTIN) 300 MG capsule Take 1 capsule by mouth in the morning and at bedtime.    Historical Provider, MD   Cobalamin Combinations (B12 FOLATE PO) Take 1 tablet by mouth daily    Historical Provider, MD   BLACK CURRANT SEED OIL PO Take 1 tablet by mouth daily    Historical Provider, MD   allopurinol (ZYLOPRIM) 300 MG tablet Take 1 tablet by mouth daily 10/02/21   Historical Provider, MD   amLODIPine (NORVASC) 10 MG tablet Take 53 tablets by mouth daily 09/21/21   Historical Provider, MD   dorzolamide-timolol (COSOPT) 22.3-6.8 MG/ML ophthalmic solution Place 1 drop into both eyes 2 times daily 08/11/21   Historical Provider, MD   glipiZIDE (GLUCOTROL XL) 2.5 MG extended release tablet Take 1 tablet by mouth daily 09/29/21   Historical Provider, MD   losartan (COZAAR) 50 MG tablet Take 1 tablet by mouth daily 10/14/21   Historical Provider, MD   metFORMIN (GLUCOPHAGE-XR) 500 MG extended release tablet Take 2 tablets by mouth 2 times daily 10/19/21   Historical Provider, MD   metoprolol succinate (TOPROL XL) 25 MG extended release tablet Take 2 tablets by mouth daily 10/02/21   Historical Provider, MD   oxyCODONE-acetaminophen (PERCOCET) 10-325 MG per tablet Take 1 tablet by mouth every 6 hours as needed. 10/02/21    Historical Provider, MD   pantoprazole (PROTONIX) 40 MG tablet Take 1 tablet by mouth daily 10/02/21   Historical Provider, MD   pravastatin (PRAVACHOL) 20 MG tablet Take 2 tablets by mouth daily 10/02/21   Historical Provider, MD       Current medications:    Current Facility-Administered Medications   Medication Dose Route Frequency Provider Last Rate Last Admin   . 0.9 % sodium chloride infusion  25 mL IntraVENous PRN Theopolis Ludger Nutting., MD 100 mL/hr at 01/01/22 0930 25 mL at 01/01/22 0930       Allergies:  No Known Allergies    Problem List:    Patient Active Problem List   Diagnosis Code   . Vomiting R11.10   . Abdominal pain R10.9   . Hypokalemia E87.6   . DM (diabetes mellitus) ([place]) E11.9   . HTN (hypertension) I10   . History of heart attack I25.2   . Abnormal stress test R94.39   . Allergic rhinitis J30.9   . Arteriosclerosis of coronary artery I25.10   . Arthralgia of right  lower leg M25.561   . Arthritis M19.90   . Benign prostatic hyperplasia N40.0   . 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 Chronic GERD K21.9   . Chronic pain G89.29   . Chronic tension-type headache, intractable G44.221   . 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 Dyslipidemia E78.5   . Heart murmur R01.1   . History of total knee arthroplasty, left [id number]   . Hyperlipidemia E78.5   . 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 Low back pain M54.50   . 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 Other sleep apnea G47.39   . 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 Cervical radiculopathy M54.12   . Coronary artery disease (CAD) excluded Z03.89       Past Medical History:        Diagnosis Date   . Abdominal pain    . CAD (coronary artery disease)     hx of MI   . Cerebral artery occlusion with cerebral infarction ([place])    . Chronic GERD    . Diabetes ([place])    . Diabetes mellitus ([place])    . Erectile  dysfunction    . Gout    . Heart disease    . History of heart attack 2018   . Hypercholesteremia    . Hypertension    . Hypertension    . Osteoarthritis    . Seizures [facility])     Patient stated as a child last seizure was around age 44   . Sleep apnea     Patient stated does not use a CPAP   . Vomiting 10/13/2021    ER visit       Past Surgical History:        Procedure Laterality Date   . CARDIAC CATHETERIZATION     . CARDIAC PROCEDURE N/A 12/22/2021    Left heart cath / coronary angiography performed by Azzie Glatter, MD at [facility] CATH LAB   . COLONOSCOPY     . EYE SURGERY Right     had eye injury   . ORTHOPEDIC SURGERY Right 2021    R knee replacement   . ORTHOPEDIC SURGERY Left 2016    [facility] [place]   L Knee replacement       Social History:    Social History     Tobacco Use   . Smoking status: Never   . Smokeless tobacco: Never   Substance Use Topics   . Alcohol use: Not Currently                                Counseling given: Not Answered      Vital Signs (Current):   Vitals:    01/01/22 0846   BP: 126/73   Pulse: 64   Resp: 18   Temp: 36.3 C (97.3 F)   TempSrc: Temporal   SpO2: 96%   Weight: 112.5 kg (248 lb)   Height: 1.753 m (_0 )                                              BP Readings  from Last 3 Encounters:   01/01/22 126/73   12/22/21 (!) 144/84   12/16/21 (!) 170/90       NPO Status: Time of last liquid consumption: 2300                        Time of last solid consumption: 2300                        Date of last liquid consumption: 12/31/21                        Date of last solid food consumption: 12/31/21    BMI:   Wt Readings from Last 3 Encounters:   01/01/22 112.5 kg (248 lb)   12/22/21 112.5 kg (248 lb)   12/16/21 112.6 kg (248 lb 3.2 oz)     Body mass index is 36.62 kg/m.    CBC:   Lab Results   Component Value Date/Time    WBC 8.7 12/15/2021 10:28 AM    RBC 4.86 12/15/2021 10:28 AM    HGB 14.0 12/15/2021 10:28 AM    HCT 42.4 12/15/2021 10:28 AM    MCV 87.2 12/15/2021 10:28 AM     RDW 14.0 12/15/2021 10:28 AM    PLT 230 12/15/2021 10:28 AM       CMP:   Lab Results   Component Value Date/Time    NA 138 12/15/2021 10:28 AM    K 3.8 12/15/2021 10:28 AM    CL 102 12/15/2021 10:28 AM    CO2 29 12/15/2021 10:28 AM    BUN 10 12/15/2021 10:28 AM    CREATININE 1.19 12/15/2021 10:28 AM    GFRAA >60 03/20/2020 08:12 AM    AGRATIO 0.9 10/13/2021 07:40 AM    LABGLOM >60 12/15/2021 10:28 AM    GLUCOSE 203 12/15/2021 10:28 AM    PROT 7.9 12/15/2021 10:28 AM    CALCIUM 9.6 12/15/2021 10:28 AM    BILITOT 0.3 12/15/2021 10:28 AM    ALKPHOS 50 12/15/2021 10:28 AM    ALKPHOS 47 10/13/2021 07:40 AM    AST 16 12/15/2021 10:28 AM    ALT 29 12/15/2021 10:28 AM       POC Tests:   Recent Labs     01/01/22  0927   POCGLU 126*       Coags:   Lab Results   Component Value Date/Time    PROTIME 13.4 12/15/2021 10:28 AM    INR 1.1 12/15/2021 10:28 AM    APTT 32.9 12/22/2021 06:52 AM       HCG (If Applicable): No results found for: PREGTESTUR, PREGSERUM, HCG, HCGQUANT     ABGs: No results found for: PHART, PO2ART, PCO2ART, HCO3ART, BEART, O2SATART     Type & Screen (If Applicable):  No results found for: LABABO, LABRH    Drug/Infectious Status (If Applicable):  No results found for: HIV, HEPCAB    COVID-19 Screening (If Applicable):   Lab Results   Component Value Date/Time    COVID19 DETECTED 01/27/2021 04:26 PM           Anesthesia Evaluation    Airway: Mallampati: II  TM distance: >3 FB   Neck ROM: full  Mouth opening: > = 3 FB   Dental: normal exam         Pulmonary:normal exam  breath sounds clear to auscultation  Cardiovascular:            Rhythm: regular  Rate: normal                    Neuro/Psych:               GI/Hepatic/Renal:             Endo/Other:                     Abdominal:             Vascular:          Other Findings:           Anesthesia Plan      MAC     ASA 3       Induction: intravenous.      Anesthetic plan and risks discussed with patient.                        Waylan Boga, APRN - CRNA   01/01/2022

## 2022-01-01 NOTE — Anesthesia Post-Procedure Evaluation (Signed)
Department of Anesthesiology  Postprocedure Note    Patient: Adrian Saunders  MRN: [id number]  Birthdate: [birth year]  Date of evaluation: 01/01/2022      Procedure Summary     Date: 01/01/22 Room / Location: [facility] / [facility]    Anesthesia Start: 1222 Anesthesia Stop: 1307    Procedure: EGD WITH BIOPSY (Mouth) Diagnosis:       Gastroesophageal reflux disease, unspecified whether esophagitis present      Nausea and vomiting, unspecified vomiting type      (Gastroesophageal reflux disease, unspecified whether esophagitis present [K21.9])      (Nausea and vomiting, unspecified vomiting type [R11.2])    Surgeons: Theopolis Ludger Nutting., MD Responsible Provider: Bayard Beaver Aissatou Fronczak, APRN - CRNA    Anesthesia Type: MAC ASA Status: 3          Anesthesia Type: No value filed.    Aldrete Phase I: Aldrete Score: 10    Aldrete Phase II:        Anesthesia Post Evaluation    Patient location during evaluation: PACU  Patient participation: complete - patient participated  Level of consciousness: awake and sleepy but conscious  Pain score: 0  Airway patency: patent  Nausea & Vomiting: no vomiting and no nausea  Complications: no  Cardiovascular status: blood pressure returned to baseline and hemodynamically stable  Respiratory status: room air  Hydration status: stable  Multimodal analgesia pain management approach

## 2022-01-01 NOTE — Progress Notes (Signed)
Dr Gilliam in speaking with the patient

## 2022-01-01 NOTE — Op Note (Signed)
EGD Procedure Note        Patient: Adrian Saunders MRN: [id number]  SSN: [id number]    Date of Birth: 07-Apr-1956  Age: 66 y.o.  Sex: male        Date/Time:  01/01/2022 12:52 PM         IMPRESSION:       Generalized gastritis  Superficial gastric antral ulcers  Distal esophagitis (grade 2  Hiatal hernia (small)       RECOMMENDATIONS:    Check biopsy results  Continue the pantoprazole 40 mg daily.  We will give patient a trial of sucralfate 1 g 4 times daily.    Procedure: Esophagogastroduodenoscopy with cold biopsies    Indication: Nausea vomiting, GERD, abdominal pain    Endoscopist:  Farrah Skoda Ludger Nutting, MD    Referring Provider:   Tommie Ard, MD    History: The history and physical exam were reviewed and updated.     Endoscope: GIF H190 Olympus video endoscope    Extent of Exam: Second part of the duodenum    ASA: ASA 2 - Patient with mild systemic disease with no functional limitations    Anethesia/Sedation:  TIVA    Description of the procedure:   The procedure was discussed with the patient including risks, benefits, alternatives including risks of iv sedation, bleeding, perforation and aspiration.  A safety timeout was performed. The patient was placed in the left lateral decubitus position.  A bite block was placed.  The patient was using standard protocol.  The patients vital signs were monitored at all times including heart rate/rhythm, blood pressure and oxygen saturation.  The endoscope was then passed under direct visualization to the second portion of the duodenum.  The endoscope was then slowly withdrawn while visualizing the mucosa.  In the stomach a retroflexion was performed and gastric fundus and cardia visualized. The patient was then transferred to recovery in stable condition.                Findings:   Esophagus:The esophageal mucosa was inflamed in the distal esophagus with irregularity of the Z-line consistent with a grade 2 esophagitis.  Stomach: The gastric mucosa was  inflamed and erythematous throughout the entire stomach with a few superficial gastric antral ulcers which were small in size.  Multiple biopsies were taken in the gastric antrum patient was also noted to have very small hiatal hernia..   Duodenum: The duodenum mucosa was normal with no ulceration, mass, stricture and no evidence of villous atrophy.     Therapies:  None    Specimens:   ID Type Source Tests Collected by Time Destination   A : Gastric Antrum  Tissue Stomach SURGICAL PATHOLOGY [name] Ludger Nutting., MD 01/01/2022 1243         Assistants:   Circulator: Gabriel Carina, RN  Scrub Person First: Gayla Medicus    DJS:[id number]    Complications:   None; patient tolerated the procedure well.     Implants: None    Discharge disposition:  Out of the recovery area when discharge criteria met         [name], MD  January 01, 2022  12:52 PM

## 2022-01-01 NOTE — Discharge Instructions (Signed)
For the next 12 hours you should not:   1. drive   2. drink alcohol   3. operate any machinery   4. engage in activities that require mental sharpness or manual dexterity such as     cooking   5. take any drugs other than those prescribed by a physician   6. make any legal or financial decisions    Call your doctor's office immediately, if there is is anything unusual:   1. increased and continuing rectal bleeding   2. fever   3. Unusual abdominal pain    Take it easy today and resume normal activity tomorrow.It is common to have gas and mild bloating for a few hours. Pain is NOT normal. You may be groggy off and on for a few hours.    Resume previous diet.        Lindell Tussey Gwenevere Ghazi, MD  01/01/2022  12:59 PM

## 2022-01-01 NOTE — Interval H&P Note (Signed)
Update History & Physical    The patient's History and Physical of January 01, 2022 was reviewed with the patient and I examined the patient. There was no change. The surgical site was confirmed by the patient and me.     Plan: The risks, benefits, expected outcome, and alternative to the recommended procedure have been discussed with the patient. Patient understands and wants to proceed with the procedure.     Electronically signed by Knox Royalty, MD on 01/01/2022 at 9:39 AM

## 2022-01-27 ENCOUNTER — Encounter

## 2022-01-27 MED ORDER — AMOXICILLIN 500 MG PO CAPS
500 | ORAL_CAPSULE | Freq: Two times a day (BID) | ORAL | 0 refills | Status: DC
Start: 2022-01-27 — End: 2024-01-25

## 2022-01-27 MED ORDER — METRONIDAZOLE 500 MG PO TABS
500 MG | ORAL_TABLET | Freq: Two times a day (BID) | ORAL | 0 refills | Status: AC
Start: 2022-01-27 — End: ?

## 2022-04-14 ENCOUNTER — Inpatient Hospital Stay
Admit: 2022-04-14 | Discharge: 2022-04-14 | Disposition: A | Discharge status: Home / Self Care | Payer: MEDICARE | Attending: Emergency Medicine

## 2022-04-14 DIAGNOSIS — E7800 Pure hypercholesterolemia, unspecified: Secondary | ICD-10-CM

## 2022-04-14 DIAGNOSIS — R739 Hyperglycemia, unspecified: Secondary | ICD-10-CM

## 2022-04-14 LAB — POCT GLUCOSE: POC Glucose: 278 mg/dL — ABNORMAL HIGH (ref 65–100)

## 2022-04-14 MED ORDER — HYDROXYZINE PAMOATE 25 MG PO CAPS
25 MG | ORAL_CAPSULE | Freq: Three times a day (TID) | ORAL | 0 refills | Status: AC | PRN
Start: 2022-04-14 — End: 2022-04-24

## 2022-04-14 NOTE — ED Provider Notes (Signed)
[facility] EMERGENCY DEPT  EMERGENCY DEPARTMENT HISTORY AND PHYSICAL EXAM      Date: 04/14/2022  Patient Name: Adrian Saunders  MRN: [id number]  Birthdate: [birth year]  Date of evaluation: 04/14/2022  Provider: Doylene Bode, MD   Note Started: 4:23 PM EDT 04/14/22    HISTORY OF PRESENT ILLNESS     Chief Complaint   Patient presents with    Hyperglycemia       History Provided By: Patient    HPI: Adrian Saunders is a 66 y.o. 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[place])     Chronic GERD     Diabetes ([place])     Diabetes mellitus ([place])     Erectile dysfunction     Gout     Heart disease     History of heart attack 2018    Hypercholesteremia     Hypertension     Hypertension     Osteoarthritis     Seizures ([place])     Patient stated as a child last seizure was around age 75    Sleep apnea     Patient stated does not use a CPAP    Vomiting 10/13/2021    ER visit       Past Surgical History:  Past Surgical History:   Procedure Laterality Date    CARDIAC CATHETERIZATION      CARDIAC PROCEDURE N/A 12/22/2021    Left heart cath / coronary angiography performed by Azzie Glatter, MD at [facility]    COLONOSCOPY      EYE SURGERY Right     had eye injury    ORTHOPEDIC SURGERY Right 2021    R knee replacement    ORTHOPEDIC SURGERY Left 2016    [facility] [place]   L Knee replacement    UPPER GASTROINTESTINAL ENDOSCOPY N/A 01/01/2022    EGD WITH BIOPSY performed by [name]., MD at [facility] ENDOSCOPY       Family History:  Family History   Problem Relation Age of Onset    Hypertension Mother     No Known Problems Father     Cancer Sister     Cancer Sister        Social History:  Social History     Tobacco Use    Smoking status: Never    Smokeless tobacco: Never   Vaping Use    Vaping Use: Never used   Substance Use Topics    Alcohol use: Not Currently     Drug use: Never       Allergies:  No Known Allergies    PCP: Tommie Ard, MD    Current Meds:   No current facility-administered medications for this encounter.     Current Outpatient Medications   Medication Sig Dispense Refill    amoxicillin (AMOXIL) 500 MG capsule Take 2 capsules by mouth 2 times daily 56 capsule 0    metroNIDAZOLE (FLAGYL) 500 MG tablet Take 1 tablet by mouth 2 times daily 28 tablet 0    Semaglutide (RYBELSUS) 3 MG TABS Take 3 mg by mouth      pantoprazole (PROTONIX) 40 MG injection Infuse 40 mg intravenously daily      aspirin 81 MG chewable  tablet Take 1 tablet by mouth daily      sucralfate (CARAFATE) 1 GM tablet Take 1 tablet by mouth 4 times daily Dissolve tablet in tablespoon or medication cup for few drops of water then take as a suspension after meals and at bedtime. 120 tablet 3    gabapentin (NEURONTIN) 300 MG capsule Take 1 capsule by mouth in the morning and at bedtime.      Cobalamin Combinations (B12 FOLATE PO) Take 1 tablet by mouth daily      BLACK CURRANT SEED OIL PO Take 1 tablet by mouth daily      allopurinol (ZYLOPRIM) 300 MG tablet Take 1 tablet by mouth daily      amLODIPine (NORVASC) 10 MG tablet Take 53 tablets by mouth daily      dorzolamide-timolol (COSOPT) 22.3-6.8 MG/ML ophthalmic solution Place 1 drop into both eyes 2 times daily      glipiZIDE (GLUCOTROL XL) 2.5 MG extended release tablet Take 1 tablet by mouth daily      losartan (COZAAR) 50 MG tablet Take 1 tablet by mouth daily      metFORMIN (GLUCOPHAGE-XR) 500 MG extended release tablet Take 2 tablets by mouth 2 times daily      metoprolol succinate (TOPROL XL) 25 MG extended release tablet Take 2 tablets by mouth daily      oxyCODONE-acetaminophen (PERCOCET) 10-325 MG per tablet Take 1 tablet by mouth every 6 hours as needed.      pantoprazole (PROTONIX) 40 MG tablet Take 1 tablet by mouth daily      pravastatin (PRAVACHOL) 20 MG tablet Take 2 tablets by mouth daily         Social Determinants of  Health:   Social Determinants of Health     Tobacco Use: Low Risk  (04/14/2022)    Patient History     Smoking Tobacco Use: Never     Smokeless Tobacco Use: Never     Passive Exposure: Not on file   Alcohol Use: Not on file   Financial Resource Strain: Not on file   Food Insecurity: Not on file   Transportation Needs: Not on file   Physical Activity: Not on file   Stress: Not on file   Social Connections: Not on file   Intimate Partner Violence: Not on file   Depression: Not at risk (12/16/2021)    PHQ-2     PHQ-2 Score: 0   Housing Stability: Not on file       PHYSICAL EXAM   Physical Exam  Vitals and nursing note reviewed.   Constitutional:       General: He is not in acute distress.     Appearance: Normal appearance. He is normal weight. He is not ill-appearing, toxic-appearing or diaphoretic.   HENT:      Head: Normocephalic and atraumatic.      Nose: Nose normal.      Mouth/Throat:      Mouth: Mucous membranes are moist.      Pharynx: No oropharyngeal exudate.   Eyes:      Extraocular Movements: Extraocular movements intact.      Conjunctiva/sclera: Conjunctivae normal.      Pupils: Pupils are equal, round, and reactive to light.   Cardiovascular:      Rate and Rhythm: Normal rate and regular rhythm.      Pulses: Normal pulses.      Heart sounds: Normal heart sounds.   Pulmonary:      Effort: Pulmonary effort is  normal.      Breath sounds: Normal breath sounds.   Abdominal:      General: Bowel sounds are normal.      Palpations: Abdomen is soft.      Tenderness: There is no abdominal tenderness.   Musculoskeletal:         General: Normal range of motion.      Cervical back: Normal range of motion and neck supple.   Skin:     General: Skin is warm and dry.      Capillary Refill: Capillary refill takes less than 2 seconds.   Neurological:      General: No focal deficit present.      Mental Status: He is alert and oriented to person, place, and time.   Psychiatric:         Mood and Affect: Mood normal.          Behavior: Behavior normal.           SCREENINGS              LAB, EKG AND DIAGNOSTIC RESULTS   Labs:  Recent Results (from the past 12 hour(s))   POCT Glucose    Collection Time: 04/14/22  4:01 PM   Result Value Ref Range    POC Glucose 278 (H) 65 - 100 mg/dL    Performed by: Ina Homes        EKG: EKG interpreted by me. Shows Normal Sinus Rhythm with a HR of 80.  No STEMI.    Radiologic Studies:  Non-plain film images such as CT, Ultrasound and MRI are read by the radiologist. Plain radiographic images are visualized and preliminarily interpreted by the ED Physician with the following findings: Not Applicable.    Interpretation per the Radiologist below, if available at the time of this note:  No orders to display        ED COURSE and DIFFERENTIAL DIAGNOSIS/MDM   CC/HPI Summary, DDx, ED Course, and Reassessment: 66 year old male reports elevated blood sugar 310 after ingesting a can of pork and beans and a banana, patient ingested vinegar to lower his blood sugar and patient has very taken metformin 2000 mg ready for the day, patient presents complaining of headache    Records Reviewed (source and summary of external notes): Prior medical records and Nursing notes    Vitals:    Vitals:    04/14/22 1613   BP: (!) 162/71   Pulse: 82   Resp: 19   Temp: 98.8 F (37.1 C)   SpO2: 99%   Weight: 114.3 kg (252 lb)   Height: 1.753 m (5\' 9" )        ED COURSE  Medical screening exam  Physical exam       Disposition Considerations (Tests not done, Shared Decision Making, Pt Expectation of Test or Treatment.):  Spoke to patient extensively about his diet managing carbohydrates and managing his sodium intake    Patient was given the following medications:  Medications - No data to display    CONSULTS: (Who and What was discussed)  None     Social Determinants affecting Dx or Tx: None    Smoking Cessation: Not Applicable    PROCEDURES   Unless otherwise noted above, none.  Procedures      CRITICAL CARE TIME   Patient does not  meet Critical Care Time, 0 minutes    FINAL IMPRESSION   No diagnosis found.      DISPOSITION/PLAN   DISPOSITION  Discharge Note: The patient is stable for discharge home. The signs, symptoms, diagnosis, and discharge instructions have been discussed, understanding conveyed, and agreed upon. The patient is to follow up as recommended or return to ER should their symptoms worsen.      PATIENT REFERRED TO:  No follow-up provider specified.      DISCHARGE MEDICATIONS:     Medication List        ASK your doctor about these medications      allopurinol 300 MG tablet  Commonly known as: ZYLOPRIM     amLODIPine 10 MG tablet  Commonly known as: NORVASC     amoxicillin 500 MG capsule  Commonly known as: AMOXIL  Take 2 capsules by mouth 2 times daily     aspirin 81 MG chewable tablet     B12 FOLATE PO     BLACK CURRANT SEED OIL PO     dorzolamide-timolol 22.3-6.8 MG/ML ophthalmic solution  Commonly known as: COSOPT     gabapentin 300 MG capsule  Commonly known as: NEURONTIN     glipiZIDE 2.5 MG extended release tablet  Commonly known as: GLUCOTROL XL     losartan 50 MG tablet  Commonly known as: COZAAR     metFORMIN 500 MG extended release tablet  Commonly known as: GLUCOPHAGE-XR     metoprolol succinate 25 MG extended release tablet  Commonly known as: TOPROL XL     metroNIDAZOLE 500 MG tablet  Commonly known as: FLAGYL  Take 1 tablet by mouth 2 times daily     oxyCODONE-acetaminophen 10-325 MG per tablet  Commonly known as: PERCOCET     * pantoprazole 40 MG injection  Commonly known as: PROTONIX     * pantoprazole 40 MG tablet  Commonly known as: PROTONIX     pravastatin 20 MG tablet  Commonly known as: PRAVACHOL     Rybelsus 3 MG Tabs  Generic drug: Semaglutide     sucralfate 1 GM tablet  Commonly known as: Carafate  Take 1 tablet by mouth 4 times daily Dissolve tablet in tablespoon or medication cup for few drops of water then take as a suspension after meals and at bedtime.           * This list has 2 medication(s)  that are the same as other medications prescribed for you. Read the directions carefully, and ask your doctor or other care provider to review them with you.                    DISCONTINUED MEDICATIONS:  Current Discharge Medication List          I am the Primary Clinician of Record: Domingo DimesSherron Benn-Thompson, MD (electronically signed)    (Please note that parts of this dictation were completed with voice recognition software. Quite often unanticipated grammatical, syntax, homophones, and other interpretive errors are inadvertently transcribed by the computer software. Please disregards these errors. Please excuse any errors that have escaped final proofreading.)     Domingo DimesSherron Benn-Thompson, MD  04/15/22 [phone]

## 2022-04-14 NOTE — ED Triage Notes (Signed)
Pt reports elevated blood glucose "for a few days". PT reports he found out his sister is dying over the weekend and has been worried about that. PT also c/o headache. Pt reports he usually gets some insulin and is better, pt on metformin. Pt denies any N/V. Pt reports "I drank a cup of vinegar for my sugar". BG noted 278 in triage.

## 2022-04-16 LAB — EKG 12-LEAD
Atrial Rate: 80 {beats}/min
P Axis: 45 degrees
P-R Interval: 160 ms
Q-T Interval: 387 ms
QRS Duration: 96 ms
QTc Calculation (Bazett): 447 ms
R Axis: 6 degrees
T Axis: -21 degrees
Ventricular Rate: 80 {beats}/min

## 2024-01-25 ENCOUNTER — Emergency Department: Admit: 2024-01-25 | Payer: Medicare (Managed Care) | Primary: Family Medicine

## 2024-01-25 ENCOUNTER — Inpatient Hospital Stay
Admit: 2024-01-25 | Discharge: 2024-01-25 | Disposition: A | Discharge status: Home / Self Care | Payer: Medicare (Managed Care) | Arrived: WI | Attending: Emergency Medicine

## 2024-01-25 DIAGNOSIS — K115 Sialolithiasis: Principal | ICD-10-CM

## 2024-01-25 LAB — CBC WITH AUTO DIFFERENTIAL
Basophils %: 0.5 % (ref 0.0–1.0)
Basophils Absolute: 0.06 10*3/uL (ref 0.00–0.10)
Eosinophils %: 0.9 % (ref 0.0–7.0)
Eosinophils Absolute: 0.11 10*3/uL (ref 0.00–0.40)
Hematocrit: 43.5 % (ref 36.6–50.3)
Hemoglobin: 14.6 g/dL (ref 12.1–17.0)
Immature Granulocytes %: 0.3 % (ref 0–0.5)
Immature Granulocytes Absolute: 0.04 10*3/uL (ref 0.00–0.04)
Lymphocytes %: 15.3 % (ref 12.0–49.0)
Lymphocytes Absolute: 1.96 10*3/uL (ref 0.80–3.50)
MCH: 29.3 pg (ref 26.0–34.0)
MCHC: 33.6 g/dL (ref 30.0–36.5)
MCV: 87.3 FL (ref 80.0–99.0)
MPV: 9.6 FL (ref 8.9–12.9)
Monocytes %: 9 % (ref 5.0–13.0)
Monocytes Absolute: 1.15 10*3/uL — ABNORMAL HIGH (ref 0.00–1.00)
Neutrophils %: 74 % (ref 32.0–75.0)
Neutrophils Absolute: 9.5 10*3/uL — ABNORMAL HIGH (ref 1.80–8.00)
Nucleated RBCs: 0 /100{WBCs}
Platelets: 183 10*3/uL (ref 150–400)
RBC: 4.98 M/uL (ref 4.10–5.70)
RDW: 12.9 % (ref 11.5–14.5)
WBC: 12.8 10*3/uL — ABNORMAL HIGH (ref 4.1–11.1)
nRBC: 0 10*3/uL (ref 0.00–0.01)

## 2024-01-25 LAB — COMPREHENSIVE METABOLIC PANEL
ALT: 28 U/L (ref 12–78)
AST: 23 U/L (ref 15–37)
Albumin/Globulin Ratio: 1.1 (ref 1.1–2.2)
Albumin: 4.1 g/dL (ref 3.5–5.0)
Alk Phosphatase: 52 U/L (ref 45–117)
Anion Gap: 6 mmol/L (ref 2–12)
BUN/Creatinine Ratio: 13 (ref 12–20)
BUN: 12 mg/dL (ref 6–20)
CO2: 32 mmol/L (ref 21–32)
Calcium: 10.1 mg/dL (ref 8.5–10.1)
Chloride: 100 mmol/L (ref 97–108)
Creatinine: 0.9 mg/dL (ref 0.70–1.30)
Est, Glom Filt Rate: >90 mL/min/{1.73_m2} (ref >60)
Globulin: 3.9 g/dL (ref 2.0–4.0)
Glucose: 113 mg/dL — ABNORMAL HIGH (ref 65–100)
Potassium: 4.3 mmol/L (ref 3.5–5.1)
Sodium: 138 mmol/L (ref 136–145)
Total Bilirubin: 0.4 mg/dL (ref 0.2–1.0)
Total Protein: 8 g/dL (ref 6.4–8.2)

## 2024-01-25 MED ORDER — KETOROLAC TROMETHAMINE 10 MG PO TABS
10 | ORAL_TABLET | Freq: Three times a day (TID) | ORAL | 0 refills | Status: AC | PRN
Start: 2024-01-25 — End: ?

## 2024-01-25 MED ORDER — SODIUM CHLORIDE 0.9 % IV SOLN (ADDEASE)
0.9 | Freq: Once | INTRAVENOUS | Status: AC
Start: 2024-01-25 — End: 2024-01-25
  Administered 2024-01-25: 17:00:00 2000 mg via INTRAVENOUS

## 2024-01-25 MED ORDER — CEPHALEXIN 500 MG PO CAPS
500 | ORAL_CAPSULE | Freq: Four times a day (QID) | ORAL | 0 refills | Status: AC
Start: 2024-01-25 — End: 2024-02-01

## 2024-01-25 MED ORDER — ACETAMINOPHEN 500 MG PO TABS
500 | ORAL | Status: AC
Start: 2024-01-25 — End: 2024-01-25
  Administered 2024-01-25: 15:00:00 1000 mg via ORAL

## 2024-01-25 MED ORDER — ONDANSETRON HCL 4 MG/2ML IJ SOLN
4 | Freq: Once | INTRAMUSCULAR | Status: AC
Start: 2024-01-25 — End: 2024-01-25
  Administered 2024-01-25: 15:00:00 4 mg via INTRAVENOUS

## 2024-01-25 MED ORDER — SODIUM CHLORIDE 0.9 % IV BOLUS
0.9 | Freq: Once | INTRAVENOUS | Status: AC
Start: 2024-01-25 — End: 2024-01-25
  Administered 2024-01-25: 15:00:00 1000 mL via INTRAVENOUS

## 2024-01-25 MED ORDER — MORPHINE SULFATE (PF) 4 MG/ML IJ SOLN
4 | INTRAMUSCULAR | Status: AC
Start: 2024-01-25 — End: 2024-01-25
  Administered 2024-01-25: 15:00:00 4 mg via INTRAVENOUS

## 2024-01-25 MED ORDER — IOPAMIDOL 76 % IV SOLN
76 | Freq: Once | INTRAVENOUS | Status: AC | PRN
Start: 2024-01-25 — End: 2024-01-25
  Administered 2024-01-25: 16:00:00 100 mL via INTRAVENOUS

## 2024-01-25 MED ORDER — ACETAMINOPHEN 500 MG PO TABS
500 | ORAL_TABLET | Freq: Four times a day (QID) | ORAL | 0 refills | Status: AC | PRN
Start: 2024-01-25 — End: ?

## 2024-01-25 MED FILL — SODIUM CHLORIDE 0.9 % IV SOLN: 0.9 % | INTRAVENOUS | Qty: 1000

## 2024-01-25 MED FILL — ISOVUE-370 76 % IV SOLN: 76 % | INTRAVENOUS | Qty: 100

## 2024-01-25 MED FILL — MORPHINE SULFATE 4 MG/ML IJ SOLN: 4 mg/mL | INTRAMUSCULAR | Qty: 1 | Fill #0

## 2024-01-25 MED FILL — CEFTRIAXONE SODIUM 2 G IJ SOLR: 2 g | INTRAMUSCULAR | Qty: 2000

## 2024-01-25 MED FILL — ONDANSETRON HCL 4 MG/2ML IJ SOLN: 4 MG/2ML | INTRAMUSCULAR | Qty: 2

## 2024-01-25 MED FILL — ACETAMINOPHEN EXTRA STRENGTH 500 MG PO TABS: 500 mg | ORAL | Qty: 2

## 2024-01-25 NOTE — ED Notes (Signed)
 In CT

## 2024-01-25 NOTE — ED Triage Notes (Signed)
 C/o swelling to rt side of throat starting last night. States a few months ago he pulled his own teeth. Rt side of neck visibly swollen. Has not taken OTC meds today Takes Percocet 10mg  at home for chronic back and neck pain

## 2024-01-25 NOTE — ED Provider Notes (Signed)
 [facility] EMERGENCY DEPT  EMERGENCY DEPARTMENT HISTORY AND PHYSICAL EXAM      Date of evaluation: 01/25/2024  Patient Name: Adrian [place] 13-Jan-1956  MRN: [id number]  ED Provider: Ozell Bigger, MD   Note Started: 5:11 AM EDT 01/31/24    HISTORY OF PRESENT ILLNESS     Chief Complaint   Patient presents with    throat swelling with pain       History Provided By: Patient, only     HPI: Adrian [place] is a 68 y.o. male History of Present Illness  Adrian [place], a 68 year old male with a history of chronic back pain, presents to the Emergency Department with complaint of swelling to the right side of his throat that started last night. The patient reports waking up with the swelling and experiencing severe pain, describing it as hurting like hell. He mentions having a similar knot years ago, but no intervention was done at that time. The affected area is visibly swollen and hot to touch.    Adrian [place] initially thought the issue was related to his teeth, but he clarifies that he pulled his own teeth years ago and believes the current problem is in his throat. He has been urinating more frequently in the last 5-6 hours since the onset of symptoms. The patient attempted to alleviate the pain by trying to get whatever it is out of it, but this was unsuccessful. He delayed seeking medical attention, trying to wait until morning to come to the hospital due to the severity of the pain.    The patient reports being away for a couple of days to visit his grandmother and mentions not taking his pain medication with him during travel, keeping it locked up at home. He experienced difficulty with his keys upon return, suggesting some level of confusion or disorientation. [name] also mentions a sensation of drowning and something seeming to draw in my eye.    Medical History  - Chronic back pain  - Previous episode of neck swelling (unspecified time in the past)    Surgical History  - Tooth  extraction performed by patient himself, several years ago    Medications and Supplements  - Percocet 10 mg    - For chronic back pain    - Kept locked up    - Patient didn't have it with him during this episode    - Didn't help much for the current pain    Social History  - Substance Use: Takes prescription pain medication for chronic back pain, stored in a locked container  - Living Situation: Recently traveled to visit grandmother    Review of Systems  General: Positive for pain.  HEENT: Positive for throat swelling, throat pain.  Genitourinary: Positive for increased urination frequency.    PAST MEDICAL HISTORY   Past Medical History:  Past Medical History:   Diagnosis Date    Abdominal pain     CAD (coronary artery disease)     hx of MI    Cerebral artery occlusion with cerebral infarction (HCC)     Chronic GERD     Diabetes (HCC)     Diabetes mellitus (HCC)     Erectile dysfunction     Gout     Heart disease     History of heart attack 2018    Hypercholesteremia     Hypertension     Hypertension     Osteoarthritis     Seizures (HCC)  Patient stated as a child last seizure was around age 27    Sleep apnea     Patient stated does not use a CPAP    Vomiting 10/13/2021    ER visit       Past Surgical History:  Past Surgical History:   Procedure Laterality Date    CARDIAC CATHETERIZATION      CARDIAC PROCEDURE N/A 12/22/2021    Left heart cath / coronary angiography performed by Laurey ONEIDA Marking, MD at [facility] CARDIAC CATH LAB    COLONOSCOPY      EYE SURGERY Right     had eye injury    ORTHOPEDIC SURGERY Right 2021    R knee replacement    ORTHOPEDIC SURGERY Left 2016    Adrian [place]   L Knee replacement    UPPER GASTROINTESTINAL ENDOSCOPY N/A 01/01/2022    EGD WITH BIOPSY performed by Nivia Myriam Raddle., MD at [facility] ENDOSCOPY       Family History:  Family History   Problem Relation Age of Onset    Hypertension Mother     No Known Problems Father     Cancer Sister     Cancer Sister        Social History:  Social History      Tobacco Use    Smoking status: Never    Smokeless tobacco: Never   Vaping Use    Vaping status: Never Used   Substance Use Topics    Alcohol use: Not Currently    Drug use: Never       Allergies:  No Known Allergies    PCP: Mamedi, Vijayalakshmi, MD    Current Meds:   No current facility-administered medications for this encounter.     Current Outpatient Medications   Medication Sig Dispense Refill    cephALEXin  (KEFLEX ) 500 MG capsule Take 1 capsule by mouth 4 times daily for 7 days 28 capsule 0    ketorolac  (TORADOL ) 10 MG tablet Take 1 tablet by mouth every 8 hours as needed for Pain 20 tablet 0    acetaminophen  (TYLENOL ) 500 MG tablet Take 2 tablets by mouth 4 times daily as needed for Pain 30 tablet 0    metroNIDAZOLE  (FLAGYL ) 500 MG tablet Take 1 tablet by mouth 2 times daily 28 tablet 0    Semaglutide (RYBELSUS) 3 MG TABS Take 3 mg by mouth      pantoprazole (PROTONIX) 40 MG injection Infuse 40 mg intravenously daily      aspirin 81 MG chewable tablet Take 1 tablet by mouth daily      sucralfate  (CARAFATE ) 1 GM tablet Take 1 tablet by mouth 4 times daily Dissolve tablet in tablespoon or medication cup for few drops of water then take as a suspension after meals and at bedtime. 120 tablet 3    gabapentin (NEURONTIN) 300 MG capsule Take 1 capsule by mouth in the morning and at bedtime.      Cobalamin Combinations (B12 FOLATE PO) Take 1 tablet by mouth daily      BLACK CURRANT SEED OIL PO Take 1 tablet by mouth daily      allopurinol (ZYLOPRIM) 300 MG tablet Take 1 tablet by mouth daily      amLODIPine (NORVASC) 10 MG tablet Take 53 tablets by mouth daily      dorzolamide-timolol (COSOPT) 22.3-6.8 MG/ML ophthalmic solution Place 1 drop into both eyes 2 times daily      glipiZIDE (GLUCOTROL XL) 2.5 MG extended release tablet  Take 1 tablet by mouth daily      losartan (COZAAR) 50 MG tablet Take 1 tablet by mouth daily      metFORMIN (GLUCOPHAGE-XR) 500 MG extended release tablet Take 2 tablets by mouth 2 times  daily      metoprolol succinate (TOPROL XL) 25 MG extended release tablet Take 2 tablets by mouth daily      oxyCODONE-acetaminophen  (PERCOCET) 10-325 MG per tablet Take 1 tablet by mouth every 6 hours as needed.      pantoprazole (PROTONIX) 40 MG tablet Take 1 tablet by mouth daily      pravastatin (PRAVACHOL) 20 MG tablet Take 2 tablets by mouth daily         Social Determinants of Health:   Social Drivers of Health     Tobacco Use: Low Risk  (01/25/2024)    Patient History     Smoking Tobacco Use: Never     Smokeless Tobacco Use: Never     Passive Exposure: Not on file   Alcohol Use: Not At Risk (09/02/2023)    Received from [facility]    AUDIT-C     Frequency of Alcohol Consumption: Never     Average Number of Drinks: Patient does not drink     Frequency of Binge Drinking: Never   Financial Resource Strain: Low Risk  (09/02/2023)    Received from [facility]    Overall Financial Resource Strain (CARDIA)     Difficulty of Paying Living Expenses: Not hard at all   Food Insecurity: No Food Insecurity (09/02/2023)    Received from [facility]    Hunger Vital Sign     Worried About Running Out of Food in the Last Year: Never true     Ran Out of Food in the Last Year: Never true   Transportation Needs: No Transportation Needs (09/02/2023)    Received from [facility]    PRAPARE - Therapist, art (Medical): No     Lack of Transportation (Non-Medical): No   Physical Activity: Inactive (09/02/2023)    Received from [facility]    Exercise Vital Sign     Days of Exercise per Week: 0 days     Minutes of Exercise per Session: 0 min   Stress: No Stress Concern Present (09/02/2023)    Received from [facility]    [employer] of Occupational Health - Occupational Stress Questionnaire     Feeling of Stress : Not at all   Social Connections: Moderately Isolated (09/02/2023)     Received from [facility]    Social Connection and Isolation Panel [NHANES]     Frequency of Communication with Friends and Family: More than three times a week     Frequency of Social Gatherings with Friends and Family: More than three times a week     Attends Religious Services: 1 to 4 times per year     Active Member of [employer] or Organizations: No     Attends Banker Meetings: Never     Marital Status: Never married   Intimate Partner Violence: Not At Risk (09/02/2023)    Received from [facility]    Humiliation, Afraid, Rape, and Kick questionnaire     Fear of Current or Ex-Partner: No     Emotionally Abused: No  Physically Abused: No     Sexually Abused: No   Depression: Not at risk (12/27/2023)    Received from [facility]    PHQ-2     PHQ Total Score: 0   Housing Stability: Unknown (12/04/2023)    Received from [facility] Stability Vital Sign     Unable to Pay for Housing in the Last Year: Not on file     Number of Times Moved in the Last Year: Not on file     Homeless in the Last Year: No   Interpersonal Safety: Not At Risk (09/02/2023)    Received from [facility]    Humiliation, Afraid, Rape, and Kick questionnaire     Fear of Current or Ex-Partner: No     Emotionally Abused: No     Physically Abused: No     Sexually Abused: No   Utilities: Not At Risk (09/02/2023)    Received from [facility]    [facility] Utilities     Threatened with loss of utilities: No     PHYSICAL EXAM   Physical Exam  Physical Examination  HEENT: Right side of neck is visibly swollen.  Skin: Swollen area on right side of neck is hot to touch.  Mucous membranes are moist  SCREENINGS     NIH Stroke Scale  NIH Stroke Scale Assessed: No          No data recorded       LAB, EKG AND DIAGNOSTIC RESULTS   Labs:   Contains abnormal data CBC with Auto Differential (Final result) Component (Lab  Inquiry)  Collection Time Result Time WBC RBC HGB HCT MCV  01/25/24 10:53:00 01/25/24 10:59:40 12.8 High  4.98 14.6 43.5 87.3     Collection Time Result Time MCH MCHC RDW PLT MPV  01/25/24 10:53:00 01/25/24 10:59:40 29.3 33.6 12.9 183 9.6     Collection Time Result Time NRBC NRBC NEUTRO PCT LYMPHO PCT MONO PCT  01/25/24 10:53:00 01/25/24 10:59:40 0.0 0.00 74.0 15.3 9.0     Collection Time Result Time EOS PCT BASOS PCT Immature Granulocytes % NEUTRO ABS LYMPHS ABS  01/25/24 10:53:00 01/25/24 10:59:40 0.9 0.5 0.3 9.50 High  1.96     Collection Time Result Time MONOS ABS EOS ABS BASOS ABS absimmgran DIFF TYPE  01/25/24 10:53:00 01/25/24 10:59:40 1.15 High  0.11 0.06 0.04 AUTOMATED     Final result         Contains abnormal data Comprehensive Metabolic Panel (Final result) Component (Lab Inquiry)  Collection Time Result Time NA K CL CO2 ANIONGAP  01/25/24 10:53:00 01/25/24 11:29:07 138 4.3 100 32 6  PLEASE NOTE NEW REFERENCE RANGE     Collection Time Result Time GLUCOSE BUN CREATININE BCR GFR  01/25/24 10:53:00 01/25/24 11:29:07 113 High  12 0.90 13 >90     Pediatric calculato...     Collection Time Result Time CALCIUM BILITOT AST ALT Alk Phosphatase  01/25/24 10:53:00 01/25/24 11:29:07 10.1 0.4 23 28  52     Collection Time Result Time PROTEIN ALBUMIN GLOB AGRATIO  01/25/24 10:53:00 01/25/24 11:29:07 8.0 4.1 3.9 1.1     Final result        Radiologic Studies:  Radiographic images are visualized and preliminarily interpreted by the ED Provider with the following findings: Not Applicable..     Interpretation per the Radiologist below, if available at the time of this note:  CT SOFT TISSUE  NECK W CONTRAST   Final Result   Right-sided submandibular gland inflammation secondary to 7 mm stone and   Wharton's duct.      Electronically signed by Devere Cal            MEDICAL DECISION MAKING and ED COURSE   5:11 AM Differential and Considerations of tests not ordered: Medical Decision Making  Mr. Frith is a 68 year old  male presenting with right-sided neck swelling and pain that started the previous night. The patient's history of self-extracting teeth years ago and the visible swelling on the right side of the neck suggest a possible salivary gland infection or abscess. The acute onset and severity of pain, along with the patient's description of a knot, support this diagnosis. Differential diagnoses include sialadenitis, salivary gland stone (sialolithiasis), or a deep space neck infection. The presence of heat in the affected area on examination further supports an infectious or inflammatory process. Given the patient's age and unclear medical history, there is a need to rule out more serious conditions such as malignancy or systemic infection. The decision to obtain blood work and imaging of the neck is appropriate to further evaluate the etiology of the swelling and guide treatment.    Right-sided neck swelling and pain  Assessment: 68 year old male presenting with acute onset of right-sided neck swelling and pain that started last night. The patient reports severe pain and visible swelling on the right side of the neck. On examination, the affected area is noted to be hot. The location and presentation are consistent with salivary gland inflammation. Patient mentions a history of a similar knot years ago that was not treated. No recent dental procedures, though patient reports pulling his own teeth a few months ago. Patient denies taking any over-the-counter medications for the current condition. Chronic back pain managed with prescription medication (10 mg, specific medication not mentioned).  Plan:  - Administer pain medication and anti-inflammatory agents for symptomatic relief  - Order blood work to assess for systemic inflammation and rule out infection  - Obtain imaging of the neck (likely CT or ultrasound) to evaluate the extent of the swelling and identify any potential abscess or obstruction    After receiving  results I spoke with ENT on-call for [facility] they recommend outpatient follow-up start oral antibiotics follow-up in the office.  Discussed elevated white count discussed signs of stone.  Discussed with patient return precautions and follow-up     Vitals:    Vitals:    01/25/24 1215 01/25/24 1230 01/25/24 1300 01/25/24 1349   BP: (!) 154/82 (!) 137/91  128/82   Pulse:    84   Resp:    16   Temp:       TempSrc:       SpO2: 96% 96% 95% 97%   Weight:       Height:           ED COURSE  ED Course as of 01/31/24 0511   Wed Jan 25, 2024   1212 IMPRESSION:  Right-sided submandibular gland inflammation secondary to 7 mm stone and  Wharton's duct.   [MC]      ED Course User Index  [MC] Sallyann Ozell ORN, MD       Patient was given the following medications:  Medications   acetaminophen  (TYLENOL ) tablet 1,000 mg (1,000 mg Oral Given 01/25/24 1056)   morphine  sulfate (PF) injection 4 mg (4 mg IntraVENous Given 01/25/24 1057)   sodium chloride  0.9 % bolus  1,000 mL (0 mLs IntraVENous Stopped 01/25/24 1130)   ondansetron  (ZOFRAN ) injection 4 mg (4 mg IntraVENous Given 01/25/24 1057)   iopamidol  (ISOVUE -370) 76 % injection 100 mL (100 mLs IntraVENous Given 01/25/24 1147)   cefTRIAXone  (ROCEPHIN ) 2,000 mg in sodium chloride  0.9 % 50 mL IVPB (addEASE) (0 mg IntraVENous Stopped 01/25/24 1321)       CONSULTS: See ED Course/MDM for further details.  None   PROCEDURES   Unless otherwise noted above, none  Procedures    SEPSIS REASSESSMENT & CRITICAL CARE TIME   SEPSIS REASSESSMENT: Patient does NOT meet Sepsis criteria after ED workup    Patient does not meet Critical Care Time, 0 minutes  CLINICAL IMPRESSIONS     1. Salivary gland stone       SDOH/DISPOSITION/PLAN   Social Determinants affecting Treatment Plan: None    DISPOSITION Decision To Discharge 01/25/2024 01:43:43 PM   DISPOSITION CONDITION Stable         Discharge Note: The patient is stable for discharge home. The signs, symptoms, diagnosis, and discharge instructions  have been discussed, understanding conveyed, and agreed upon. The patient is to follow up as recommended or return to ER should their symptoms worsen.      PATIENT REFERRED TO:  Sal Ache, MD  [address]  [place] [address]  [phone]          Veleria Peon, MD  [address]  [place] [address]  [phone]    Call   for followup        DISCHARGE MEDICATIONS:     Medication List        START taking these medications      acetaminophen  500 MG tablet  Commonly known as: TYLENOL   Take 2 tablets by mouth 4 times daily as needed for Pain     cephALEXin  500 MG capsule  Commonly known as: KEFLEX   Take 1 capsule by mouth 4 times daily for 7 days     ketorolac  10 MG tablet  Commonly known as: TORADOL   Take 1 tablet by mouth every 8 hours as needed for Pain            ASK your doctor about these medications      allopurinol 300 MG tablet  Commonly known as: ZYLOPRIM     amLODIPine 10 MG tablet  Commonly known as: NORVASC     aspirin 81 MG chewable tablet     B12 FOLATE PO     BLACK CURRANT SEED OIL PO     dorzolamide-timolol 2-0.5 % ophthalmic solution  Commonly known as: COSOPT     gabapentin 300 MG capsule  Commonly known as: NEURONTIN     glipiZIDE 2.5 MG extended release tablet  Commonly known as: GLUCOTROL XL     losartan 50 MG tablet  Commonly known as: COZAAR     metFORMIN 500 MG extended release tablet  Commonly known as: GLUCOPHAGE-XR     metoprolol succinate 25 MG extended release tablet  Commonly known as: TOPROL XL     metroNIDAZOLE  500 MG tablet  Commonly known as: FLAGYL   Take 1 tablet by mouth 2 times daily     oxyCODONE-acetaminophen  10-325 MG per tablet  Commonly known as: PERCOCET     * pantoprazole 40 MG injection  Commonly known as: PROTONIX     * pantoprazole 40 MG tablet  Commonly known as: PROTONIX     pravastatin 20 MG tablet  Commonly known as: PRAVACHOL     Rybelsus 3 MG Tabs  Generic drug: Semaglutide     sucralfate  1 GM tablet  Commonly known as:  Carafate   Take 1 tablet by mouth 4 times daily Dissolve tablet in tablespoon or medication cup for few drops of water then take as a suspension after meals and at bedtime.           * This list has 2 medication(s) that are the same as other medications prescribed for you. Read the directions carefully, and ask your doctor or other care provider to review them with you.                   Where to Get Your Medications        These medications [facility] sent to [id number] [place] [place] - [phone] [phone]  [address], [place] [address]      Phone: [phone]   acetaminophen  500 MG tablet  cephALEXin  500 MG capsule  ketorolac  10 MG tablet           DISCONTINUED MEDICATIONS:  Discharge Medication List as of 01/25/2024  1:45 PM        STOP taking these medications       amoxicillin  (AMOXIL ) 500 MG capsule Comments:   Reason for Stopping:               I am the Primary Clinician of Record. Ozell Bigger, MD (electronically signed)    (Please note that parts of this dictation were completed with voice recognition software. Quite often unanticipated grammatical, syntax, homophones, and other interpretive errors are inadvertently transcribed by the computer software. Please disregards these errors. Please excuse any errors that have escaped final proofreading.)     Bigger Ozell ORN, MD  01/31/24 [phone]

## 2024-01-25 NOTE — Discharge Instructions (Addendum)
 Thank you for choosing our Emergency Department for your care.  It is our privilege to care for you in your time of need.  In the next several days, you may receive a survey via email or mailed to your home about your experience with our team.  We would greatly appreciate you taking a few minutes to complete the survey, as we use this information to learn what we have done well and what we could be doing better. Thank you for trusting us  with your care!    Below you will find a list of your tests from today's visit.   Labs and Radiology Studies  Recent Results (from the past 12 hours)   CBC with Auto Differential    Collection Time: 01/25/24 10:53 AM   Result Value Ref Range    WBC 12.8 (H) 4.1 - 11.1 K/uL    RBC 4.98 4.10 - 5.70 M/uL    Hemoglobin 14.6 12.1 - 17.0 g/dL    Hematocrit 56.4 63.3 - 50.3 %    MCV 87.3 80.0 - 99.0 FL    MCH 29.3 26.0 - 34.0 PG    MCHC 33.6 30.0 - 36.5 g/dL    RDW 87.0 88.4 - 85.4 %    Platelets 183 150 - 400 K/uL    MPV 9.6 8.9 - 12.9 FL    Nucleated RBCs 0.0 0.0 PER 100 WBC    nRBC 0.00 0.00 - 0.01 K/uL    Neutrophils % 74.0 32.0 - 75.0 %    Lymphocytes % 15.3 12.0 - 49.0 %    Monocytes % 9.0 5.0 - 13.0 %    Eosinophils % 0.9 0.0 - 7.0 %    Basophils % 0.5 0.0 - 1.0 %    Immature Granulocytes % 0.3 0 - 0.5 %    Neutrophils Absolute 9.50 (H) 1.80 - 8.00 K/UL    Lymphocytes Absolute 1.96 0.80 - 3.50 K/UL    Monocytes Absolute 1.15 (H) 0.00 - 1.00 K/UL    Eosinophils Absolute 0.11 0.00 - 0.40 K/UL    Basophils Absolute 0.06 0.00 - 0.10 K/UL    Immature Granulocytes Absolute 0.04 0.00 - 0.04 K/UL    Differential Type AUTOMATED     Comprehensive Metabolic Panel    Collection Time: 01/25/24 10:53 AM   Result Value Ref Range    Sodium 138 136 - 145 mmol/L    Potassium 4.3 3.5 - 5.1 mmol/L    Chloride 100 97 - 108 mmol/L    CO2 32 21 - 32 mmol/L    Anion Gap 6 2 - 12 mmol/L    Glucose 113 (H) 65 - 100 mg/dL    BUN 12 6 - 20 mg/dL    Creatinine 9.09 9.29 - 1.30 mg/dL    BUN/Creatinine Ratio  13 12 - 20      Est, Glom Filt Rate >90 >60 ml/min/1.24m2    Calcium 10.1 8.5 - 10.1 mg/dL    Total Bilirubin 0.4 0.2 - 1.0 mg/dL    AST 23 15 - 37 U/L    ALT 28 12 - 78 U/L    Alk Phosphatase 52 45 - 117 U/L    Total Protein 8.0 6.4 - 8.2 g/dL    Albumin 4.1 3.5 - 5.0 g/dL    Globulin 3.9 2.0 - 4.0 g/dL    Albumin/Globulin Ratio 1.1 1.1 - 2.2       CT SOFT TISSUE NECK W CONTRAST  Result Date: 01/25/2024  INDICATION: right sided swelling,  tender salivary gland v cystic elsion. very tender, to palpation. COMPARISON: None TECHNIQUE:  Axial CT neck following administration of IV contrast. Sagittal and coronal reformats were generated.  CT dose reduction was achieved through use of a standardized protocol tailored for this examination and automatic exposure control for dose modulation. CONTRAST: 100 mL Isovue  370 FINDINGS: NASOPHARYNX: Normal. SUPRAHYOID NECK: Normal oropharynx, oral cavity, parapharyngeal space, and retropharyngeal space. INFRAHYOID NECK: Normal larynx, hypopharynx and supraglottis. PAROTID GLANDS: Normal. SUBMANDIBULAR GLANDS: Enlarged enhancing right submandibular gland. Ductal dilatation to the level of the 7 mm stone in Whartons duct (image 41).. THYROID GLAND: Normal. No nodule. LYMPH NODES: None enlarged by CT size criteria . LUNG APICES: Clear. OSSEOUS STRUCTURES: No destructive bone lesion. To space narrowing at multiple lower cervical regions. ADDITIONAL COMMENTS: N/A.     Right-sided submandibular gland inflammation secondary to 7 mm stone and Wharton's duct. Electronically signed by Devere Cal    ------------------------------------------------------------------------------------------------------------  The evaluation and treatment you received in the Emergency Department were for an urgent problem. It is important that you follow-up with a doctor, nurse practitioner, or physician assistant to:  (1) confirm your diagnosis,  (2) re-evaluation of changes in your illness and treatment, and (3)  for ongoing care. Please take your discharge instructions with you when you go to your follow-up appointment.     If you have any problem arranging a follow-up appointment, contact us !  If your symptoms become worse or you do not improve as expected, please return to us . We are available 24 hours a day.     If a prescription has been provided, please fill it as soon as possible to prevent a delay in treatment. If you have any questions or reservations about taking the medication due to side effects or interactions with other medications, please call your primary care provider or contact us  directly.  Again, THANK YOU for choosing us  to care for YOU!

## 2024-02-15 NOTE — Other (Signed)
 Patient states he is aware to hold Metformin 2 days prior to planned procedure on 02/20/24

## 2024-02-20 ENCOUNTER — Inpatient Hospital Stay: Payer: MEDICARE | Attending: Student in an Organized Health Care Education/Training Program

## 2024-02-20 LAB — COMPREHENSIVE METABOLIC PANEL
ALT: 26 U/L (ref 12–78)
AST: 20 U/L (ref 15–37)
Albumin/Globulin Ratio: 1.1 (ref 1.1–2.2)
Albumin: 3.6 g/dL (ref 3.5–5.0)
Alk Phosphatase: 45 U/L (ref 45–117)
Anion Gap: 5 mmol/L (ref 2–12)
BUN/Creatinine Ratio: 10 — ABNORMAL LOW (ref 12–20)
BUN: 8 mg/dL (ref 6–20)
CO2: 28 mmol/L (ref 21–32)
Calcium: 9.4 mg/dL (ref 8.5–10.1)
Chloride: 106 mmol/L (ref 97–108)
Creatinine: 0.83 mg/dL (ref 0.70–1.30)
Est, Glom Filt Rate: >90 ml/min/1.73m2 (ref >60)
Globulin: 3.4 g/dL (ref 2.0–4.0)
Glucose: 92 mg/dL (ref 65–100)
Potassium: 3.9 mmol/L (ref 3.5–5.1)
Sodium: 139 mmol/L (ref 136–145)
Total Bilirubin: 0.5 mg/dL (ref 0.2–1.0)
Total Protein: 7 g/dL (ref 6.4–8.2)

## 2024-02-20 LAB — CBC WITH AUTO DIFFERENTIAL
Basophils %: 0.5 % (ref 0.0–1.0)
Basophils Absolute: 0.04 K/UL (ref 0.00–0.10)
Eosinophils %: 2.4 % (ref 0.0–7.0)
Eosinophils Absolute: 0.21 K/UL (ref 0.00–0.40)
Hematocrit: 39.9 % (ref 36.6–50.3)
Hemoglobin: 13.3 g/dL (ref 12.1–17.0)
Immature Granulocytes %: 0.5 % (ref 0–0.5)
Immature Granulocytes Absolute: 0.04 K/UL (ref 0.00–0.04)
Lymphocytes %: 26.7 % (ref 12.0–49.0)
Lymphocytes Absolute: 2.34 K/UL (ref 0.80–3.50)
MCH: 28.5 pg (ref 26.0–34.0)
MCHC: 33.3 g/dL (ref 30.0–36.5)
MCV: 85.6 FL (ref 80.0–99.0)
MPV: 10.2 FL (ref 8.9–12.9)
Monocytes %: 11.9 % (ref 5.0–13.0)
Monocytes Absolute: 1.04 K/UL — ABNORMAL HIGH (ref 0.00–1.00)
Neutrophils %: 58 % (ref 32.0–75.0)
Neutrophils Absolute: 5.09 K/UL (ref 1.80–8.00)
Nucleated RBCs: 0 /100{WBCs}
Platelets: 214 K/uL (ref 150–400)
RBC: 4.66 M/uL (ref 4.10–5.70)
RDW: 13.1 % (ref 11.5–14.5)
WBC: 8.8 K/uL (ref 4.1–11.1)
nRBC: 0 K/uL (ref 0.00–0.01)

## 2024-02-20 LAB — APTT: APTT: 33.7 s (ref 21.2–34.1)

## 2024-02-20 LAB — POCT GLUCOSE
POC Glucose: 96 mg/dL (ref 65–100)
POC Glucose: 79 mg/dL (ref 65–100)

## 2024-02-20 LAB — PROTIME-INR
INR: 1.1 (ref 0.9–1.1)
Protime: 13.9 s (ref 11.9–14.1)

## 2024-02-20 MED ORDER — HEPARIN (PORCINE) IN NACL 1000-0.9 UT/500ML-% IV SOLN
1000-0.9 | INTRAVENOUS | Status: AC
Start: 2024-02-20 — End: ?

## 2024-02-20 MED ORDER — HEPARIN (PORCINE) IN NACL 1000-0.9 UT/500ML-% IV SOLN
1000-0.9 | INTRAVENOUS | Status: AC | PRN
Start: 2024-02-20 — End: 2024-02-20
  Administered 2024-02-20: 18:00:00 500

## 2024-02-20 MED ORDER — IOPAMIDOL 76 % IV SOLN
76 | INTRAVENOUS | Status: DC | PRN
Start: 2024-02-20 — End: 2024-02-20
  Administered 2024-02-20: 18:00:00 60 via INTRA_ARTERIAL

## 2024-02-20 MED ORDER — VERAPAMIL HCL 2.5 MG/ML IV SOLN
2.5 | INTRAVENOUS | Status: DC | PRN
Start: 2024-02-20 — End: 2024-02-20
  Administered 2024-02-20: 18:00:00 3 via INTRA_ARTERIAL

## 2024-02-20 MED ORDER — SODIUM CHLORIDE 0.9 % IV SOLN
0.9 | INTRAVENOUS | Status: DC | PRN
Start: 2024-02-20 — End: 2024-02-20

## 2024-02-20 MED ORDER — LIDOCAINE HCL 1 % IJ SOLN
1 | INTRAMUSCULAR | Status: DC | PRN
Start: 2024-02-20 — End: 2024-02-20
  Administered 2024-02-20: 18:00:00 1 via INTRADERMAL

## 2024-02-20 MED ORDER — HEPARIN SODIUM (PORCINE) 1000 UNIT/ML IJ SOLN
1000 | INTRAMUSCULAR | Status: AC
Start: 2024-02-20 — End: ?

## 2024-02-20 MED ORDER — LIDOCAINE HCL 1 % IJ SOLN
1 | INTRAMUSCULAR | Status: AC
Start: 2024-02-20 — End: ?

## 2024-02-20 MED ORDER — VERAPAMIL HCL 2.5 MG/ML IV SOLN
2.5 | INTRAVENOUS | Status: AC
Start: 2024-02-20 — End: ?

## 2024-02-20 MED ORDER — MIDAZOLAM HCL 2 MG/2ML IJ SOLN
2 | INTRAMUSCULAR | Status: AC
Start: 2024-02-20 — End: ?

## 2024-02-20 MED ORDER — NORMAL SALINE FLUSH 0.9 % IV SOLN
0.9 | Freq: Two times a day (BID) | INTRAVENOUS | Status: DC
Start: 2024-02-20 — End: 2024-02-20

## 2024-02-20 MED ORDER — MIDAZOLAM HCL 2 MG/2ML IJ SOLN
2 | INTRAMUSCULAR | Status: DC | PRN
Start: 2024-02-20 — End: 2024-02-20
  Administered 2024-02-20: 18:00:00 1 via INTRAVENOUS

## 2024-02-20 MED ORDER — NORMAL SALINE FLUSH 0.9 % IV SOLN
0.9 | INTRAVENOUS | Status: DC | PRN
Start: 2024-02-20 — End: 2024-02-20

## 2024-02-20 MED ORDER — ROSUVASTATIN CALCIUM 40 MG PO TABS
40 | ORAL_TABLET | Freq: Every day | ORAL | 2 refills | 30.00000 days | Status: AC
Start: 2024-02-20 — End: ?

## 2024-02-20 MED ORDER — FENTANYL CITRATE PF 50 MCG/ML IJ SOSY
50 | INTRAMUSCULAR | Status: DC | PRN
Start: 2024-02-20 — End: 2024-02-20
  Administered 2024-02-20: 18:00:00 50 via INTRAVENOUS

## 2024-02-20 MED ORDER — ACETAMINOPHEN 325 MG PO TABS
325 | ORAL | Status: DC | PRN
Start: 2024-02-20 — End: 2024-02-20

## 2024-02-20 MED ORDER — HEPARIN SODIUM (PORCINE) 1000 UNIT/ML IJ SOLN
1000 | INTRAMUSCULAR | Status: DC | PRN
Start: 2024-02-20 — End: 2024-02-20
  Administered 2024-02-20: 18:00:00 5000 via INTRA_ARTERIAL

## 2024-02-20 MED ORDER — SODIUM CHLORIDE 0.9 % IV SOLN
0.9 | INTRAVENOUS | Status: DC
Start: 2024-02-20 — End: 2024-02-20

## 2024-02-20 MED ORDER — FENTANYL CITRATE (PF) 100 MCG/2ML IJ SOLN
100 | INTRAMUSCULAR | Status: AC
Start: 2024-02-20 — End: ?

## 2024-02-20 MED FILL — VERAPAMIL HCL 2.5 MG/ML IV SOLN: 2.5 mg/mL | INTRAVENOUS | Qty: 2 | Fill #0

## 2024-02-20 MED FILL — SODIUM CHLORIDE FLUSH 0.9 % IV SOLN: 0.9 % | INTRAVENOUS | Qty: 40 | Fill #0

## 2024-02-20 MED FILL — MIDAZOLAM HCL 2 MG/2ML IJ SOLN: 2 mg/mL | INTRAMUSCULAR | Qty: 2 | Fill #0

## 2024-02-20 MED FILL — SODIUM CHLORIDE 0.9 % IV SOLN: 0.9 % | INTRAVENOUS | Qty: 1000 | Fill #0

## 2024-02-20 MED FILL — FENTANYL CITRATE (PF) 100 MCG/2ML IJ SOLN: 100 MCG/2ML | INTRAMUSCULAR | Qty: 2 | Fill #0

## 2024-02-20 MED FILL — LIDOCAINE HCL 1 % IJ SOLN: 1 % | INTRAMUSCULAR | Qty: 10 | Fill #0

## 2024-02-20 MED FILL — HEPARIN (PORCINE) IN NACL 1000-0.9 UT/500ML-% IV SOLN: 1000-0.9 UT/500ML-% | INTRAVENOUS | Qty: 1500 | Fill #0

## 2024-02-20 MED FILL — HEPARIN SODIUM (PORCINE) 1000 UNIT/ML IJ SOLN: 1000 [IU]/mL | INTRAMUSCULAR | Qty: 10 | Fill #0

## 2024-02-20 NOTE — Discharge Instructions (Signed)
 [facility]  Cardiac Catheterization Department  [address]  [place], [place] [address]  [phone]        Coronary Angiogram: What to Expect at Home  Your Recovery  A coronary angiogram is a test to examine the large blood vessels of your heart (coronary arteries).  The doctor inserted a thin, flexible tube (catheter into a blood vessel in your groin or wrist.  Your groin or wrist may have a bruise and feel sore for a few days after the procedure.  You can do light activities around the house.  But do not do anything strenuous until your doctor says it is ok.  This may be for several days.  This care sheet gives you a general idea about how long it will take for you to recover.  But each person recovers at a different pace.  Follow the steps below to feel better as quickly as possible.    How can you care for yourself at home?    Activity  If the doctor gave you a sedative:  For 24 hours, don't do anything that requires attention to detail, such as going to work, making important decisions, or signing any legal documents.  It takes time for the medicine's effects to completely wear off.  For your safety, do not drive or operate any machinery that could be dangerous.  Wait until the medicine wears off and you can think clearly and react easily.  Do not do any strenuous exercise and do not lift, pull, or push anything heavier than 5 pounds (approximately the weight of 1 gallon of milk) until your doctor says it is ok.  This may be for several days.  You can walk around the house and do light activity, such as cooking.  If the catheter was placed in your groin and you must use stairs, just go up and down slowly in as few trips as possible for the first couple of days.  If the catheter was placed in your wrist, do not bend your wrist deeply for the first couple of days.  A soft wrist-splint may be placed on your wrist as a reminder following the procedure.  Be careful using your hand to get  into and out of a chair or bed and when opening doors.   Get regular exercise, after being cleared by your cardiologist.  Walking may be a good choice.  Try for at least 30 minutes on most days of the week.  If you smoke or use smokeless tobacco products, including vaping products, it is extremely important that you quit using these products.  Please ask you nurse or doctor for more information about smoking cessation.    Diet  Drink plenty of fluids to help you body flush out the dye.  If you have kidney, heart, or liver disease and have to limit fluids, talk to your doctor before increasing the amount of fluids you drink.  Keep eating a heart-healthy diet that has lots of fruits, vegetables, and whole grains.  If you have not been eating this way, talk to your doctor.  You also may want to talk to a dietician.  This expert can help you to learn about healthy foods and plan meals.    Medicines  Your doctor will tell you if and when you can restart your medicines.  You will also be given instructions about taking any new medicines.  Take these medicines as prescribed.  Continue taking your cholesterol lowering medications ("  statins"), if you have been prescribed this.  You doctor may prescribe a blood-thinning medicine like aspirin or clopidogrel (Plavix), plasugrel (Effient), or ticagrelor (Brilinta).  If you had angioplasty or a stent placement, you must continue aspirin and Plavix, Effient, or Brilinta.  It is very important that you take these medicines exactly as directed to keep the coronary artery open and reduce your risk of heart attack.  Be safe with medicines.  Call your doctor if you think you are having a problem with taking or obtaining your medicines, notify your doctor immediately.  You cannot afford to miss your medications.  Do not stop taking these medications without speaking to your cardiologist first.   Do not strain to have a bowel movement.  Ask your doctor if you feel you may need a stool  softener of laxative.    Care of the catheter site  For 1 or 2 days, keep a bandage over the spot where the catheter(s) was inserted.  The bandage probably will fall off in this time.  Put ice or a cold pack on the area for 10 to 20 minutes at a time to help with soreness of swelling.  Place a thin cloth between the ice and your skin.  You may shower 24 to 48 hours after the procedure, if your doctor says it is okay.  Pat the incision dry with a clean towel afterwards.  Do not soak the catheter site until it is healed.  Don't take a bath for 1 week, or until your doctor tells you it is okay to do so.  The use of lotions and powders is not recommended at the site until it is completely healed.  Watch for bleeding from the site.  A small amount of blood (up to the size of a quarter) on the bandage can be normal.  If you cough, sneeze, or laugh vigorously, apply direct pressure with your hand over the catheter site.  If you notice a little bit of blood from the catheter site (similar to a paper cut or shaving nick), lie down and press firmly on the area for 15 minutes to try and make it stop bleeding.  If the bleeding does not stop, call your doctor or seek immediate medical care.  If you notice heavy bleeding at the site that has either saturated the bandage or is squirting, lie down, press firmly on the site, and have someone call 911.    Follow-up care is a key part of your treatment and safety.  Be sure to make and go to all appointments and call your doctor if you are having problems.  It's also a good idea to know your test results and keep a list of medicines you take.    When should you call for help?    Call 911 anytime you think you may need emergency care.    For example, call if:  You passed out (lost consciousness).  You have severe trouble breathing.  You have sudden chest pain and shortness of breath, or you cough up blood.  Call 911 if you have symptoms of a heart attack, such as:  Chest Pain, pressure,  squeezing, or burning.  Sweating.  Shortness of breath or difficulty breathing.  Nausea or vomiting.  Jaw pain, shoulder pain, or arm pain in one or both sides.  Feelings of fullness.  Excessive fatigue or weakness.  New onset anxiety.  New onset of upper back pain.  Dizziness or lightheadedness.  A fast  or uneven pulse.  Call 911 if you have been diagnosed with angina, and you have symptoms that do not go away with rest or are not getting better within 5 minutes of taking a dose of your nitroglycerin.  After you call 911, the operator may tell you to chew 1 adult-strength (325 mg) of 2 to 4 low-dose aspirin (81 mg).  Wait for ambulance.  Do not drive yourself.    Call your doctor now or seek immediate medical care if:  You are bleeding from the area where the catheter was inserted.  You have a fast-growing, painful lump at the catheter site.  You have signs of infection, such as"  Increased pain, swelling, warmth or redness at the catheter site.  Red streaks leading from the catheter site.  Pus draining from the catheter site.  A fever.  Your leg or hand is painful, looks blue, or feels cold, numb, or tingly.     Watch closely for changes in your health and be sure to contact your doctor if you have any problems.

## 2024-02-20 NOTE — Progress Notes (Signed)
 Pt states ok to review d/c instructions with ex-girlfriend (angela)

## 2024-02-20 NOTE — H&P (Signed)
 HISTORY AND PHYSICAL    CHIEF COMPLAINT:  abnormal cardiac PET/CT    HISTORY OF PRESENT ILLNESS:  Adrian Saunders is a 68 y.o. year-old male with past medical history significant for CAD, HTN, HLD, diabetes who was evaluated today due to elective cardiac catheterization due to abnormal MPI completed outpatient on 01/05/24.      PET/CT 01/05/24: Myocardial perfusion imaging is abnormal. There is a moderate area of ischemia. The ischemia is located in the apical and inferior myocardial wall(s). Overall left ventricular systolic function was normal with regional wall motion abnormalities. The left ventricular ejection fraction was calculated or visually estimated to be 60 %.     Echocardiogram 01/05/24: LV Ejection Fraction is 65-70 %. Moderate left ventricular hypertrophy. There is mild mitral valve regurgitation.There is trace aortic regurgitation.    Records from hospital admission course thus far reviewed.      Telemetry reviewed.      INPATIENT MEDICATIONS:  Home medications reviewed.    Current Facility-Administered Medications:     0.9 % sodium chloride  infusion, , IntraVENous, Continuous, Gill, Jeneane RAMAN, MD     ALLERGIES:  Allergies reviewed with the patient,No Known Allergies .      FAMILY HISTORY:  Family history reviewed.       SOCIAL HISTORY:  Notable for no tobacco use, no heavy alcohol or illicit drug use.      REVIEW OF SYSTEMS:  Complete review of systems performed, pertinents noted above, all other systems are negative.    PHYSICAL EXAMINATION:    General:  NAD  Cardiovascular:  RRR, no murmur  Respiratory:  Lungs are clear  Abdomen:  soft, nontender  Extremities:  no lower extremity edema  Skin:  Dry, warm  Psych:  Normal affect      Vitals:    02/20/24 1035   BP: (!) 147/77   Pulse: 70   Resp: 16   Temp: 98.7 F (37.1 C)   SpO2: 97%       Recent labs results and imaging reviewed.     Discussed case with Dr. Marvis and our impression and recommendations are as follows:  1. Coronary  arteriosclerosis  -Mild diffuse disease LAD, CTO prox RCA (per cardiac cath report 2023/Dr. Niazi) Recommendation for medical management and possible CTO intervention at [facility] if warranted in the future.  abnormal PET/CT with moderate area of ischemia. Will obtain cardiac catheterization to assess coronary anatomy. Risks, benefits, alternatives discussed in detail, will proceed with cath  continue aspirin/statin  2.Essential hypertension  BP at goal  mild LVH by echo (12/2023)  Valsartan 160 mg/d  continue home monitoring    Hyperlipidemia, unspecified hyperlipidemia type  continue statin therapy  target LDL < 55  Continue statin therapy   Diabetes mellitus without complication: per outpt regimen    Thank you for involving us  in the care of this patient.  Please do not hesitate to call me or Dr. Marvis if additional questions arise.    Jon CHRISTELLA Lee, FNP  02/20/2024

## 2024-02-20 NOTE — Progress Notes (Addendum)
 1635--Patient arrives to [facility] from PACU. Report/SBAR received from Bruin, Charity fundraiser. Pt VS stable. Drink and snack offered and provided to patient. Right radial site assessed. Dressing is clean, dry and intact. +2 radial pulse. Pt condition stable at this time. Call bell within reach.   1720-- Discharge instructions printed and provided to patient with all questions answered in full. Dr. Marvis at bedside. PIV x1 removed with cath tip intact. Pt VSS and no signs of distress noted. Pt tolerating liquids and solids with no issues. Right radial site assessed. Dressing is clean, dry and intact. +2 radial pulse. Arm board present. Pt aware of follow-up appt. Pt ambulating within room with no issues. Pt wheeled down to main entrance accompanied by staff. Pt condition stable at time of discharge.
